# Patient Record
Sex: Female | Born: 1965
Health system: Southern US, Community
[De-identification: ages and names within clinical notes are randomized; demographics above are authoritative.]

## PROBLEM LIST (undated history)

## (undated) DIAGNOSIS — K219 Gastro-esophageal reflux disease without esophagitis: Secondary | ICD-10-CM

## (undated) DIAGNOSIS — R61 Generalized hyperhidrosis: Secondary | ICD-10-CM

## (undated) DIAGNOSIS — F329 Major depressive disorder, single episode, unspecified: Secondary | ICD-10-CM

## (undated) DIAGNOSIS — F419 Anxiety disorder, unspecified: Secondary | ICD-10-CM

## (undated) DIAGNOSIS — E8881 Metabolic syndrome: Secondary | ICD-10-CM

## (undated) DIAGNOSIS — F32A Depression, unspecified: Secondary | ICD-10-CM

## (undated) DIAGNOSIS — E785 Hyperlipidemia, unspecified: Secondary | ICD-10-CM

## (undated) DIAGNOSIS — R748 Abnormal levels of other serum enzymes: Secondary | ICD-10-CM

## (undated) DIAGNOSIS — R102 Pelvic and perineal pain: Secondary | ICD-10-CM

## (undated) DIAGNOSIS — G43909 Migraine, unspecified, not intractable, without status migrainosus: Secondary | ICD-10-CM

## (undated) DIAGNOSIS — G4700 Insomnia, unspecified: Secondary | ICD-10-CM

## (undated) HISTORY — DX: Depression, unspecified: F32.A

## (undated) HISTORY — DX: Hyperlipidemia, unspecified: E78.5

## (undated) HISTORY — DX: Metabolic syndrome: E88.81

## (undated) HISTORY — DX: Pelvic and perineal pain: R10.2

## (undated) HISTORY — DX: Insomnia, unspecified: G47.00

## (undated) HISTORY — DX: Metabolic syndrome: E88.810

## (undated) HISTORY — PX: EXCISION VAGINAL CYST: SHX5825

## (undated) HISTORY — DX: Gastro-esophageal reflux disease without esophagitis: K21.9

## (undated) HISTORY — DX: Generalized hyperhidrosis: R61

## (undated) HISTORY — DX: Major depressive disorder, single episode, unspecified: F32.9

## (undated) HISTORY — DX: Migraine, unspecified, not intractable, without status migrainosus: G43.909

## (undated) HISTORY — DX: Abnormal levels of other serum enzymes: R74.8

## (undated) HISTORY — DX: Anxiety disorder, unspecified: F41.9

---

## 1996-12-20 HISTORY — PX: TUBAL LIGATION: SHX77

## 1998-12-20 HISTORY — PX: SHOULDER ARTHROSCOPY: SHX128

## 2009-01-03 ENCOUNTER — Ambulatory Visit: Payer: Self-pay | Admitting: Internal Medicine

## 2009-06-02 ENCOUNTER — Ambulatory Visit: Payer: Self-pay | Admitting: Internal Medicine

## 2009-10-27 ENCOUNTER — Other Ambulatory Visit: Payer: Self-pay | Admitting: Internal Medicine

## 2009-10-30 ENCOUNTER — Ambulatory Visit: Payer: Self-pay | Admitting: Internal Medicine

## 2011-09-09 ENCOUNTER — Other Ambulatory Visit: Payer: Self-pay | Admitting: Internal Medicine

## 2013-07-30 ENCOUNTER — Ambulatory Visit: Payer: Self-pay | Admitting: Family

## 2013-12-20 HISTORY — PX: ABDOMINAL HYSTERECTOMY: SHX81

## 2014-03-06 ENCOUNTER — Encounter: Payer: Self-pay | Admitting: Obstetrics & Gynecology

## 2014-03-06 ENCOUNTER — Ambulatory Visit (INDEPENDENT_AMBULATORY_CARE_PROVIDER_SITE_OTHER): Payer: No Typology Code available for payment source | Admitting: Obstetrics & Gynecology

## 2014-03-06 VITALS — BP 113/69 | HR 90 | Ht <= 58 in | Wt 112.0 lb

## 2014-03-06 DIAGNOSIS — N938 Other specified abnormal uterine and vaginal bleeding: Secondary | ICD-10-CM

## 2014-03-06 DIAGNOSIS — N920 Excessive and frequent menstruation with regular cycle: Secondary | ICD-10-CM

## 2014-03-06 DIAGNOSIS — N949 Unspecified condition associated with female genital organs and menstrual cycle: Secondary | ICD-10-CM

## 2014-03-06 NOTE — Patient Instructions (Signed)
Fibroids Fibroids are lumps (tumors) that can occur any place in a woman's body. These lumps are not cancerous. Fibroids vary in size, weight, and where they grow. HOME CARE  Do not take aspirin.  Write down the number of pads or tampons you use during your period. Tell your doctor. This can help determine the best treatment for you. GET HELP RIGHT AWAY IF:  You have pain in your lower belly (abdomen) that is not helped with medicine.  You have cramps that are not helped with medicine.  You have more bleeding between or during your period.  You feel lightheaded or pass out (faint).  Your lower belly pain gets worse. MAKE SURE YOU:  Understand these instructions.  Will watch your condition.  Will get help right away if you are not doing well or get worse. Document Released: 01/08/2011 Document Revised: 02/28/2012 Document Reviewed: 01/08/2011 ExitCare Patient Information 2014 [employer].  

## 2014-03-06 NOTE — Progress Notes (Signed)
   Subjective:    Patient ID: Renee Park, female    DOB: [birth year], 48 y.o.   MRN: [id number]  HPI  48 yo MW Renee Park is here with long h/o menorrhagia and now DUB with worsening clots and pain. She had a EMBX this month that showed no cancer.  Review of Systems Pap smear and mammogram reportedly normal 8/14    Objective:   Physical Exam  Normal cervix, moderate blood in vault 10-12 week size mobile uterus, normal adnexal exam      Assessment & Plan:  Menorrhagia, DUB, fibroids on exam I will get an u/s, TSH, and CBC and proceed according to results She declines provera today

## 2014-03-06 NOTE — Progress Notes (Signed)
   Subjective:    Patient ID: Renee Park, female    DOB: January 12, 1966, 48 y.o.   MRN: [id number]  HPI  48 yo  MW [id number] (32, 30, and 25) here with the complaint of "messed up periods". Periods are usually very long, last weeks. She has also had worsening clots and some pain. She had a EMBX last week at [facility]. It was normal. She has not had an u/s or blood work.   Review of Systems Pap is UTD.    Objective:   Physical Exam        Assessment & Plan:  DUB/menorrhagia- check TSH, CBC, and gyn u/s.

## 2014-03-07 LAB — TSH: TSH: 1.531 u[IU]/mL (ref 0.350–4.500)

## 2014-03-07 LAB — CBC
HCT: 36.4 % (ref 36.0–46.0)
Hemoglobin: 11.9 g/dL — ABNORMAL LOW (ref 12.0–15.0)
MCH: 28.1 pg (ref 26.0–34.0)
MCHC: 32.7 g/dL (ref 30.0–36.0)
MCV: 85.8 fL (ref 78.0–100.0)
PLATELETS: 348 10*3/uL (ref 150–400)
RBC: 4.24 MIL/uL (ref 3.87–5.11)
RDW: 15.3 % (ref 11.5–15.5)
WBC: 11.6 10*3/uL — ABNORMAL HIGH (ref 4.0–10.5)

## 2014-03-12 ENCOUNTER — Ambulatory Visit (HOSPITAL_COMMUNITY)
Admission: RE | Admit: 2014-03-12 | Discharge: 2014-03-12 | Disposition: A | Discharge status: Home / Self Care | Payer: No Typology Code available for payment source | Source: Ambulatory Visit | Attending: Obstetrics & Gynecology | Admitting: Obstetrics & Gynecology

## 2014-03-12 DIAGNOSIS — N920 Excessive and frequent menstruation with regular cycle: Secondary | ICD-10-CM | POA: Insufficient documentation

## 2014-03-12 DIAGNOSIS — N938 Other specified abnormal uterine and vaginal bleeding: Secondary | ICD-10-CM

## 2014-03-12 DIAGNOSIS — D251 Intramural leiomyoma of uterus: Secondary | ICD-10-CM | POA: Insufficient documentation

## 2014-03-12 DIAGNOSIS — N83209 Unspecified ovarian cyst, unspecified side: Secondary | ICD-10-CM | POA: Insufficient documentation

## 2014-03-12 DIAGNOSIS — D252 Subserosal leiomyoma of uterus: Secondary | ICD-10-CM | POA: Insufficient documentation

## 2014-03-21 ENCOUNTER — Ambulatory Visit (INDEPENDENT_AMBULATORY_CARE_PROVIDER_SITE_OTHER): Payer: No Typology Code available for payment source | Admitting: Obstetrics & Gynecology

## 2014-03-21 ENCOUNTER — Encounter: Payer: Self-pay | Admitting: Obstetrics & Gynecology

## 2014-03-21 VITALS — BP 126/81 | HR 93 | Ht <= 58 in | Wt 110.0 lb

## 2014-03-21 DIAGNOSIS — N949 Unspecified condition associated with female genital organs and menstrual cycle: Secondary | ICD-10-CM

## 2014-03-21 DIAGNOSIS — N938 Other specified abnormal uterine and vaginal bleeding: Secondary | ICD-10-CM

## 2014-03-21 NOTE — Patient Instructions (Signed)
Endometrial Ablation Endometrial ablation removes the lining of the uterus (endometrium). It is usually a same-day, outpatient treatment. Ablation helps avoid major surgery, such as surgery to remove the cervix and uterus (hysterectomy). After endometrial ablation, you will have little or no menstrual bleeding and may not be able to have children. However, if you are premenopausal, you will need to use a reliable method of birth control following the procedure because of the small chance that pregnancy can occur. There are different reasons to have this procedure, which include:  Heavy periods.  Bleeding that is causing anemia.  Irregular bleeding.  Bleeding fibroids on the lining inside the uterus if they are smaller than 3 centimeters. This procedure should not be done if:  You want children in the future.  You have severe cramps with your menstrual period.  You have precancerous or cancerous cells in your uterus.  You were recently pregnant.  You have gone through menopause.  You have had major surgery on the uterus, such as a cesarean delivery. LET YOUR HEALTH CARE PROVIDER KNOW ABOUT:  Any allergies you have.  All medicines you are taking, including vitamins, herbs, eye drops, creams, and over-the-counter medicines.  Previous problems you or members of your family have had with the use of anesthetics.  Any blood disorders you have.  Previous surgeries you have had.  Medical conditions you have. RISKS AND COMPLICATIONS  Generally, this is a safe procedure. However, as with any procedure, complications can occur. Possible complications include:  Perforation of the uterus.  Bleeding.  Infection of the uterus, bladder, or vagina.  Injury to surrounding organs.  An air bubble to the lung (air embolus).  Pregnancy following the procedure.  Failure of the procedure to help the problem, requiring hysterectomy.  Decreased ability to diagnose cancer in the lining of  the uterus. BEFORE THE PROCEDURE  The lining of the uterus must be tested to make sure there is no pre-cancerous or cancer cells present.  An ultrasound may be performed to look at the size of the uterus and to check for abnormalities.  Medicines may be given to thin the lining of the uterus. PROCEDURE  During the procedure, your health care provider will use a tool called a resectoscope to help see inside your uterus. There are different ways to remove the lining of your uterus.   Radiofrequency  This method uses a radiofrequency-alternating electric current to remove the lining of the uterus.  Cryotherapy This method uses extreme cold to freeze the lining of the uterus.  Heated-Free Liquid  This method uses heated salt (saline) solution to remove the lining of the uterus.  Microwave This method uses high-energy microwaves to heat up the lining of the uterus to remove it.  Thermal balloon  This method involves inserting a catheter with a balloon tip into the uterus. The balloon tip is filled with heated fluid to remove the lining of the uterus. AFTER THE PROCEDURE  After your procedure, do not have sexual intercourse or insert anything into your vagina until permitted by your health care provider. After the procedure, you may experience:  Cramps.  Vaginal discharge.  Frequent urination. Document Released: 10/15/2004 Document Revised: 08/08/2013 Document Reviewed: 05/09/2013 ExitCare Patient Information 2014 [employer]. Hysterectomy Information  A hysterectomy is a surgery in which your uterus is removed. This surgery may be done to treat various medical problems. After the surgery, you will no longer have menstrual periods. The surgery will also make you unable to become pregnant (  sterile). The fallopian tubes and ovaries can be removed (bilateral salpingo-oophorectomy) during this surgery as well.  REASONS FOR A HYSTERECTOMY  Persistent, abnormal bleeding.  Lasting  (chronic) pelvic pain or infection.  The lining of the uterus (endometrium) starts growing outside the uterus (endometriosis).  The endometrium starts growing in the muscle of the uterus (adenomyosis).  The uterus falls down into the vagina (pelvic organ prolapse).  Noncancerous growths in the uterus (uterine fibroids) that cause symptoms.  Precancerous cells.  Cervical cancer or uterine cancer. TYPES OF HYSTERECTOMIES  Supracervical hysterectomy In this type, the top part of the uterus is removed, but not the cervix.  Total hysterectomy The uterus and cervix are removed.  Radical hysterectomy The uterus, the cervix, and the fibrous tissue that holds the uterus in place in the pelvis (parametrium) are removed. WAYS A HYSTERECTOMY CAN BE PERFORMED  Abdominal hysterectomy A large surgical cut (incision) is made in the abdomen. The uterus is removed through this incision.  Vaginal hysterectomy An incision is made in the vagina. The uterus is removed through this incision. There are no abdominal incisions.  Conventional laparoscopic hysterectomy Three or four small incisions are made in the abdomen. A thin, lighted tube with a camera (laparoscope) is inserted into one of the incisions. Other tools are put through the other incisions. The uterus is cut into small pieces. The small pieces are removed through the incisions, or they are removed through the vagina.  Laparoscopically assisted vaginal hysterectomy (LAVH) Three or four small incisions are made in the abdomen. Part of the surgery is performed laparoscopically and part vaginally. The uterus is removed through the vagina.  Robot-assisted laparoscopic hysterectomy A laparoscope and other tools are inserted into 3 or 4 small incisions in the abdomen. A computer-controlled device is used to give the surgeon a 3D image and to help control the surgical instruments. This allows for more precise movements of surgical instruments. The uterus  is cut into small pieces and removed through the incisions or removed through the vagina. RISKS AND COMPLICATIONS  Possible complications associated with this procedure include:  Bleeding and risk of blood transfusion. Tell your health care provider if you do not want to receive any blood products.  Blood clots in the legs or lung.  Infection.  Injury to surrounding organs.  Problems or side effects related to anesthesia.  Conversion to an abdominal hysterectomy from one of the other techniques. WHAT TO EXPECT AFTER A HYSTERECTOMY  You will be given pain medicine.  You will need to have someone with you for the first 3 5 days after you go home.  You will need to follow up with your surgeon in 2 4 weeks after surgery to evaluate your progress.  You may have early menopause symptoms such as hot flashes, night sweats, and insomnia.  If you had a hysterectomy for a problem that was not cancer or not a condition that could lead to cancer, then you no longer need Pap tests. However, even if you no longer need a Pap test, a regular exam is a good idea to make sure no other problems are starting. Document Released: 06/01/2001 Document Revised: 09/26/2013 Document Reviewed: 08/13/2013 ExitCare Patient Information 2014 [employer].  

## 2014-03-21 NOTE — Progress Notes (Signed)
   Subjective:    Patient ID: Renee Park, female    DOB: 30-Jul-1966, 48 y.o.   MRN: [id number]  HPI  Renee Park is a 48 yo MW lady here today to discuss her labs/u/s as work up for DUB. Her hbg is 11.9, TSH normal, u/s normal with 2 small fibroids (not submucosal).   Review of Systems In the past few months she has urinary pressure but no UTI per her. She also reports seeing blood with her urine.    Objective:   Physical Exam        Assessment & Plan:  DUB- probably perimenopausal DUB I have offered her OCPs, Novasure ablation, versus a hysterectomy. She and her husband will discuss it and call when they have a decision. I also offered for her to get a second opinion With regard to her urinary symptoms, I have suggested that she see a urologist.

## 2014-04-30 ENCOUNTER — Ambulatory Visit: Payer: Self-pay | Admitting: Obstetrics and Gynecology

## 2014-04-30 LAB — BASIC METABOLIC PANEL
Anion Gap: 7 (ref 7–16)
BUN: 12 mg/dL (ref 7–18)
CHLORIDE: 113 mmol/L — AB (ref 98–107)
CREATININE: 0.73 mg/dL (ref 0.60–1.30)
Calcium, Total: 9 mg/dL (ref 8.5–10.1)
Co2: 22 mmol/L (ref 21–32)
EGFR (African American): >60
EGFR (Non-African Amer.): >60
Glucose: 88 mg/dL (ref 65–99)
Osmolality: 282 (ref 275–301)
POTASSIUM: 3.6 mmol/L (ref 3.5–5.1)
Sodium: 142 mmol/L (ref 136–145)

## 2014-04-30 LAB — CBC
HCT: 36.5 % (ref 35.0–47.0)
HGB: 12.3 g/dL (ref 12.0–16.0)
MCH: 30 pg (ref 26.0–34.0)
MCHC: 33.7 g/dL (ref 32.0–36.0)
MCV: 89 fL (ref 80–100)
PLATELETS: 337 10*3/uL (ref 150–440)
RBC: 4.1 10*6/uL (ref 3.80–5.20)
RDW: 15.8 % — ABNORMAL HIGH (ref 11.5–14.5)
WBC: 6.5 10*3/uL (ref 3.6–11.0)

## 2014-05-06 ENCOUNTER — Inpatient Hospital Stay: Payer: Self-pay | Admitting: Obstetrics and Gynecology

## 2014-05-07 LAB — PATHOLOGY REPORT

## 2014-05-07 LAB — HEMOGLOBIN: HGB: 9.8 g/dL — ABNORMAL LOW (ref 12.0–16.0)

## 2014-10-04 ENCOUNTER — Ambulatory Visit: Payer: Self-pay | Admitting: Obstetrics and Gynecology

## 2014-10-21 ENCOUNTER — Encounter: Payer: Self-pay | Admitting: Obstetrics & Gynecology

## 2014-11-08 ENCOUNTER — Ambulatory Visit: Payer: Self-pay | Admitting: Internal Medicine

## 2015-01-06 IMAGING — US US PELVIS COMPLETE
1 series · 13 of 25 positions shown · non-contrast
Comparison: None

CLINICAL DATA: Menorrhagia



[Series 1: us pelvis complete · 13 of 87 slices shown]
[im 1/87]
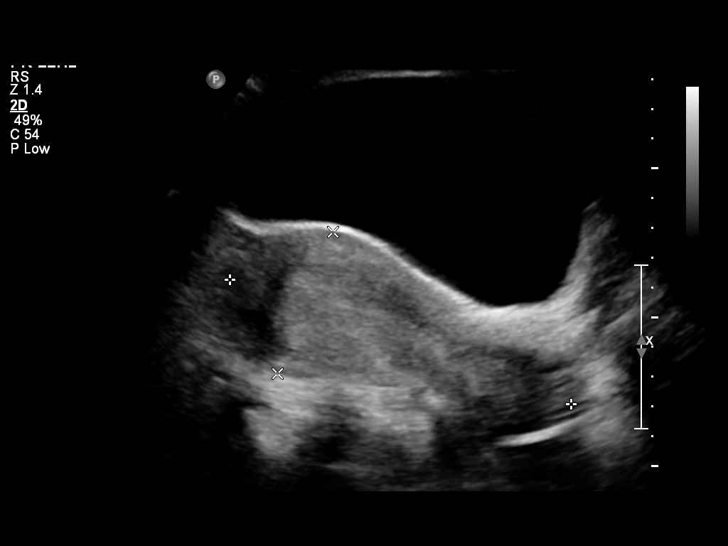
[im 8/87]
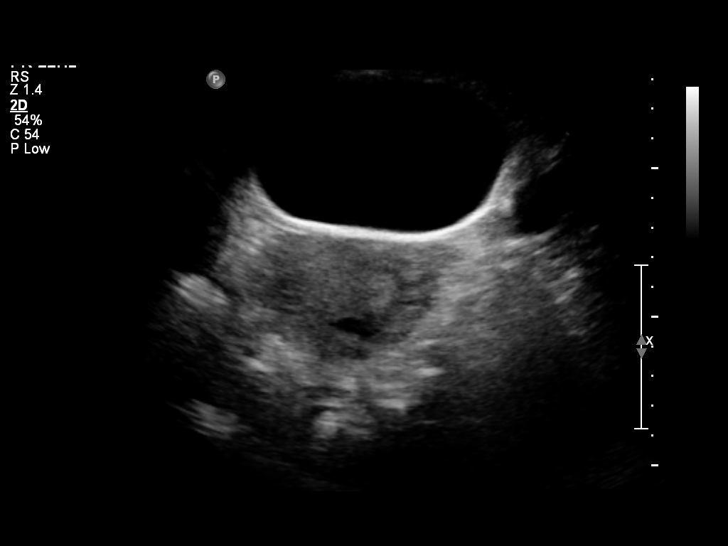
[im 15/87]
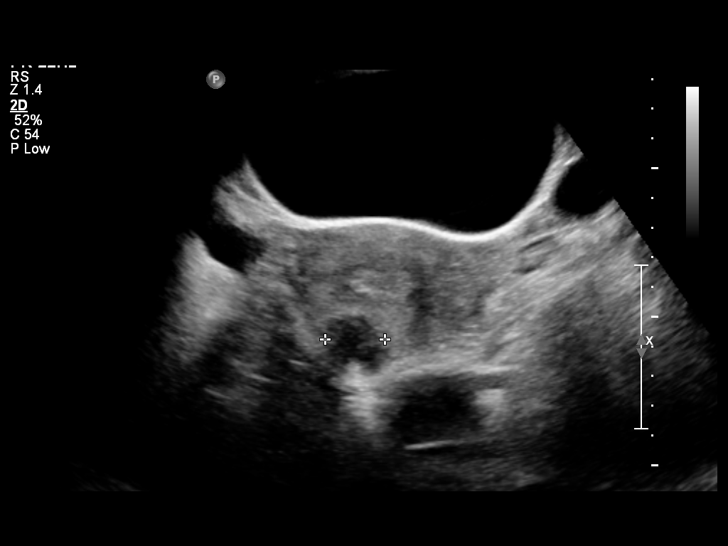
[im 22/87]
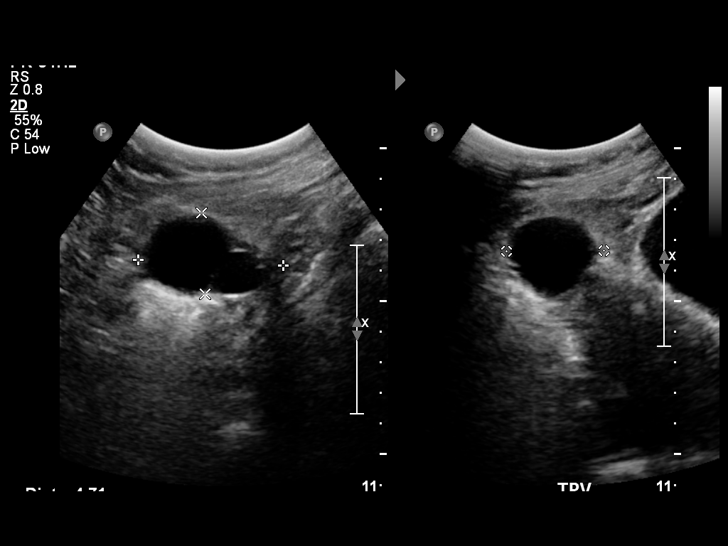
[im 29/87]
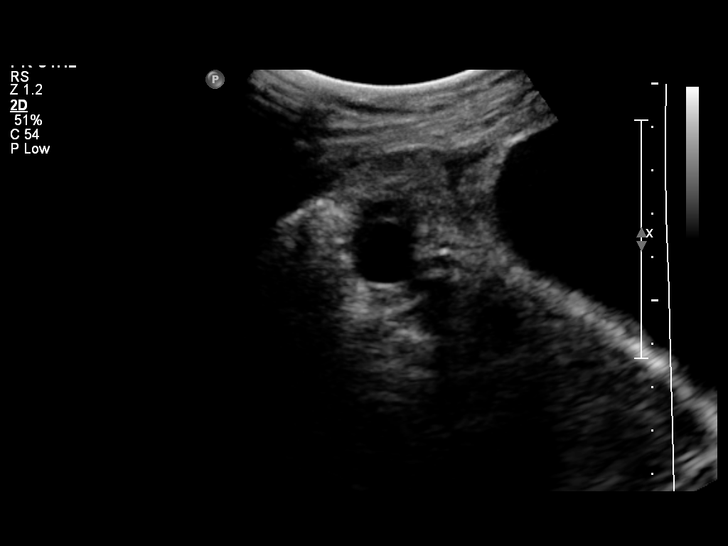
[im 36/87]
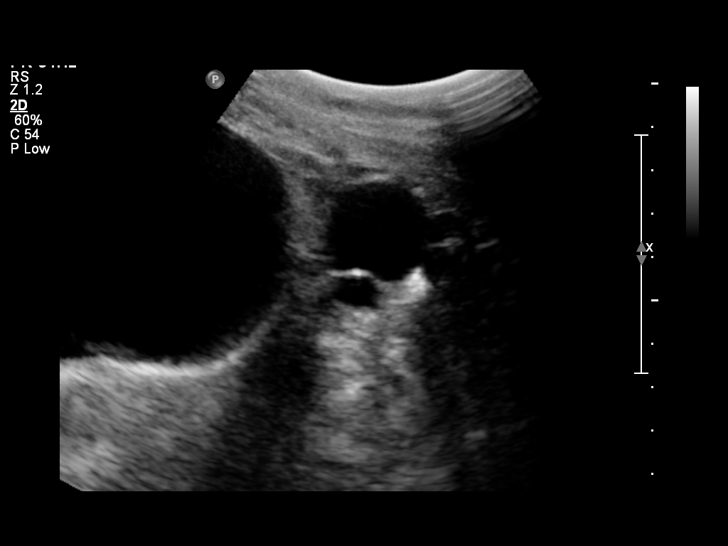
[im 44/87]
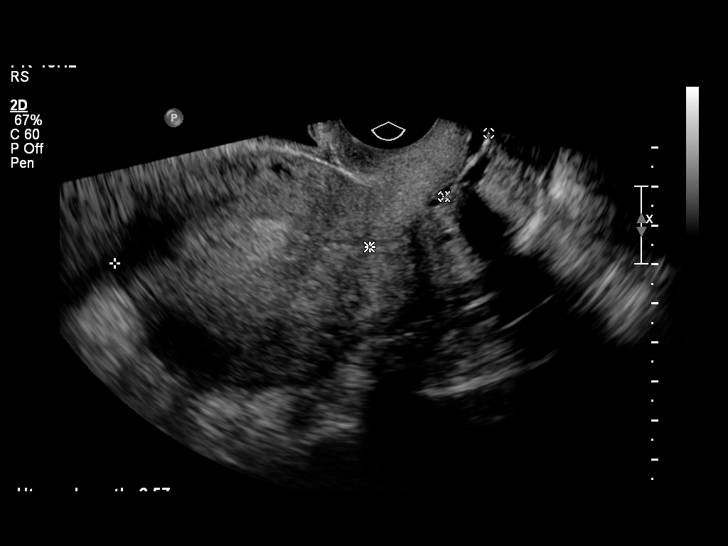
[im 51/87]
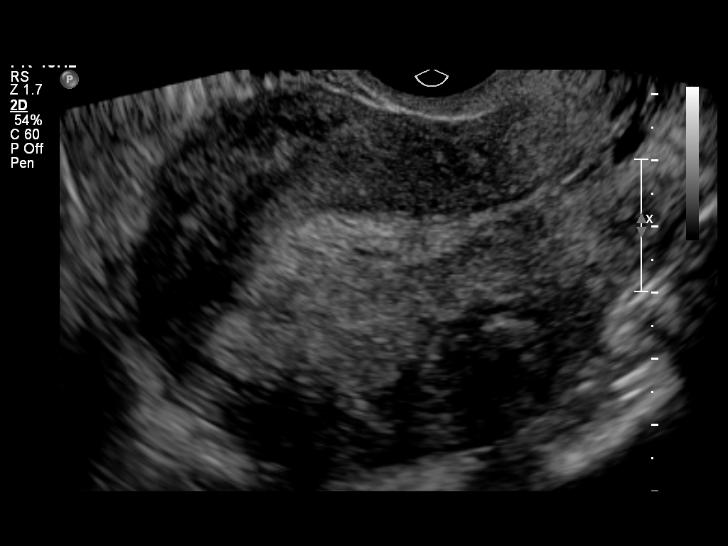
[im 58/87]
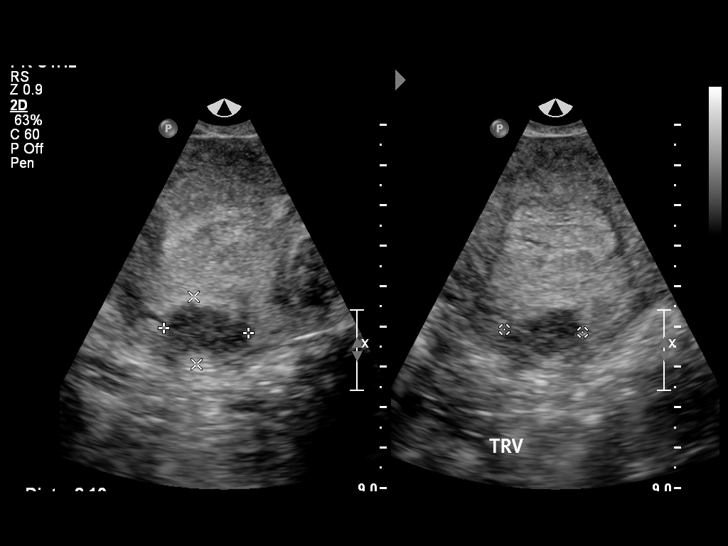
[im 65/87]
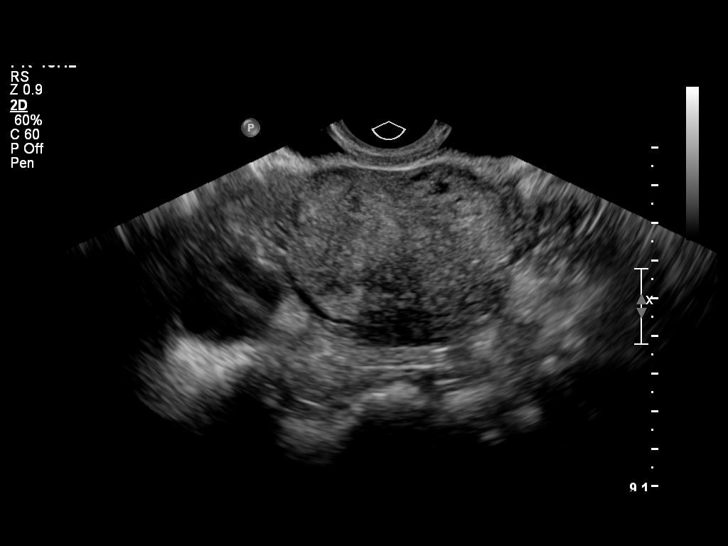
[im 72/87]
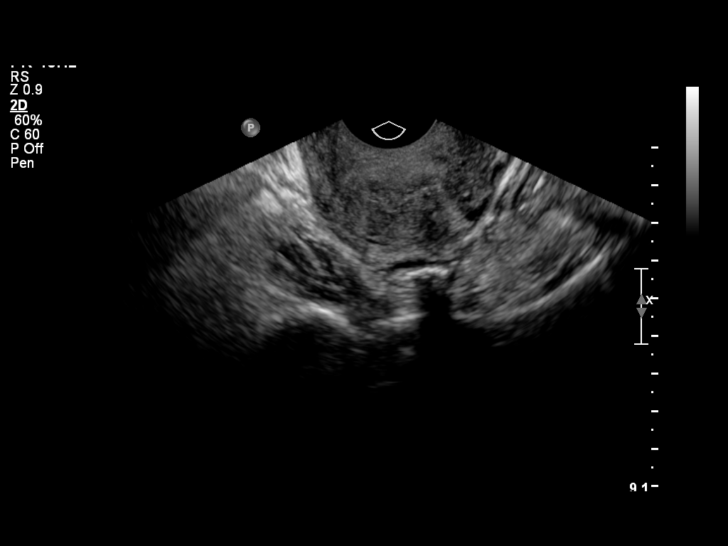
[im 79/87]
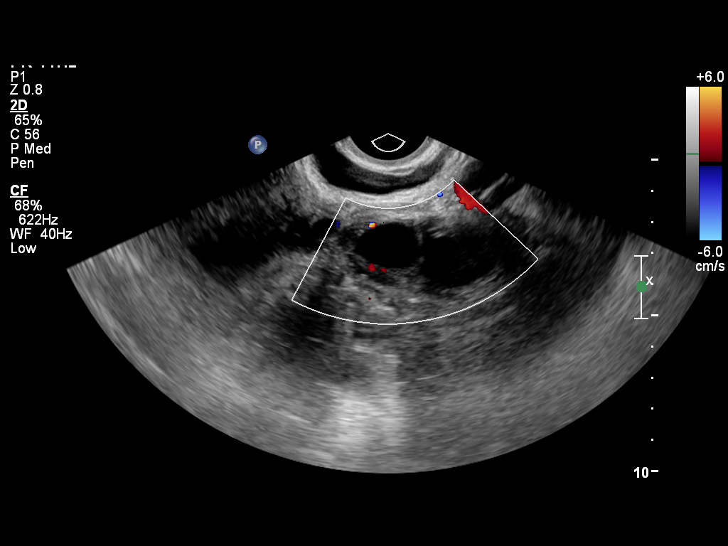
[im 87/87]
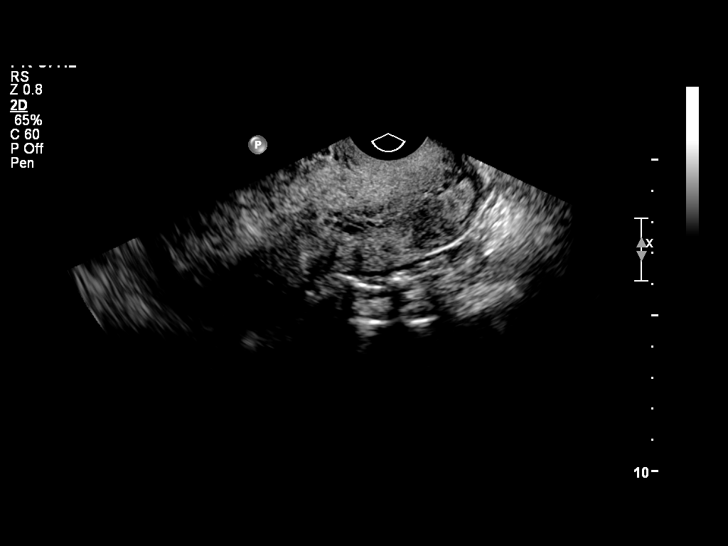

[13 of 25 positions shown; findings below may reference images not displayed]

FINDINGS: Uterus

Measurements: 10.9 x 5.9 x 6.2 cm. Subserosal midline fundal fibroid
measures 2.1 x 1.9 x 1.7 cm. Posterior intramural fibroid measures
2.2 x 2.1 x 1.7 cm.

Endometrium

Thickness: 10.5 mm. Mildly heterogeneous. Few scattered tiny cystic
areas within the endometrium.

Right ovary

Measurements: 4.7 x 2.6 x 3.2 cm. Small cysts or follicles 2.5 cm or
less in size. No adnexal masses. No suspicious abnormality.

Left ovary

Measurements: 5.2 x 2.7 x 5.3 cm.. Small cysts or follicles 2.6 cm
or less in size. No adnexal masses. No suspicious abnormality.

Other findings

No free fluid.
IMPRESSION: At least 2 small fibroids as described above.

Few scattered tiny cystic areas within the endometrium. Consider
followup pelvic ultrasound in the 3-6 months to assess stability.

## 2015-04-12 NOTE — Op Note (Signed)
PATIENT NAME:  Renee Park, Renee Park MR#:  [id number] DATE OF BIRTH:  13-Jun-1966  DATE OF PROCEDURE:  05/06/2014  PREOPERATIVE DIAGNOSES:  1.  Menorrhagia.  2.  Leiomyoma uteri.  3.  Vaginal cysts x 2.  POSTOPERATIVE DIAGNOSES: 1.  Menorrhagia.  2.  Leiomyoma uteri.  3.  Vaginal cysts x 2.  OPERATIVE PROCEDURE:  1.  Excisional biopsy of vaginal cysts x 2.  2.  Total abdominal hysterectomy with bilateral salpingo-oophorectomy.   SURGEON: Brayton Mars, M.D.   FIRST ASSISTANT: Dr. Marcelline Mates.  ANESTHESIA: General endotracheal.   INDICATIONS: The patient is a 49 year old white female with history of menorrhagia to anemia secondary to uterine fibroids, presents for definitive surgery. The patient also has 2 cystic lesions within the vagina that are each approximately 3 cm in diameter. These are to be drained and biopsied.   FINDINGS AT SURGERY: Multi-fibroid uterus approximately 12 weeks' size. There were minimal adhesions between the omentum and right adnexal region. The tubes and ovaries were grossly normal. The vagina was notable for two 3 cm cysts located in the distal vagina, the first was noted on the left side at the introitus while the second was noted just inside the introitus on the right side. Both were mucin filled.   DESCRIPTION OF PROCEDURE: The patient was brought to the operating room where she was placed in the supine position. General endotracheal anesthesia was induced without difficulty. She was placed in the low lithotomy position using the bumblebee stirrups. A ChloraPrep and Betadine abdominal, perineal, intravaginal prep and drape was performed in standard fashion. Foley catheter was placed and was draining clear yellow urine. The vaginal cyst biopsies were then performed. The vaginal cyst was grasped with an Allis clamp and excisional biopsy was made using an elliptical incision. This was done on both sides of the vagina. The cyst wall and vagina were sent for  pathology. Mucin secretions were released. The biopsy sites were closed with a simple running suture of 2-0 chromic. Good hemostasis was noted. Upon completion of the vaginal cyst excision, hysterectomy was then performed in routine fashion.   The patient was placed in the lithotomy position. Operator's gloves were changed. The abdomen was opened with a Pfannenstiel incision. Fascia was incised transversely and extended bilaterally with Mayo scissors. The rectus muscle was dissected off the fascia through sharp and blunt dissection. The midline raphe was identified, separated and the peritoneum was entered. The uterus was mobile and was able to be elevated to the abdominal wall. The adhesions between the omentum and the right adnexa were taken down with Bovie cautery. Using a malleable retractor as well as a Metallurgist, exposure was obtained and procedure was performed in routine fashion. The round ligaments were doubly clamped, cut, and stick tied using 0 Vicryl suture. These were tagged. The posterior and anterior leafs of the broad ligament were opened. The infundibulopelvic ligament was isolated on each side and a window was created. Ligaments were then doubly clamped and cut using 0 Vicryl suture. The first tie was a free tie with a second suture being placed, which was a stick tie. The ligation of these vessels was noted to be away from the course of the ureters.   Next, bladder flap was created through sharp and blunt dissection. The cardinal and broad ligament complexes were skeletonized to expose the uterine vessels. Uterine vessels were doubly clamped, cut, and stick tied using 0 Vicryl suture. The remainder of the cardinal and broad ligament complexes were then  taken down using the clamping, cutting, and stick tying technique. This was carried out to the level of the cervicovaginal junction, which was then crossclamped with curved Heaney clamps. The specimen was then excised from the  operative field. The angles of the vagina were closed using a Richardson stitch of 0 Vicryl. The intervening vaginal cuff was reapproximated using 2-0 Vicryl sutures in a simple interrupted manner using figure-of-eight stitches. Following closure of the cuff, the pelvis was irrigated and hemostasis was noted. All pedicles were noted to be dry. The appendix was noted to be very small, atrophic, and normal in appearance. Upper abdomen revealed no adhesions or masses. The abdomen was then closed in layers with 0 Maxon being used on the fascia in a simple running manner. The skin was closed with a subcuticular stitch of 4-0 Vicryl. Dermabond was placed over the incision. A Lidoderm patch was placed on both superior and inferior aspects of the incision. Telfa and 4 x 4 pads were used to cover the wound. The patient was then awakened, extubated and taken to the recovery room in satisfactory condition.   ESTIMATED BLOOD LOSS: 100 mL.   IV FLUIDS: 1500 mL.   URINE OUTPUT: 75 mL.  ANTIBIOTICS: The patient did receive 1 gram Ancef antibiotic prophylaxis.  ____________________________ Alanda Slim Nhat Hearne, MD mad:aw D: 05/06/2014 09:57:22 ET T: 05/06/2014 10:17:34 ET JOB#: [id number]  cc: Hassell Done A. Sibbie Flammia, MD, <Dictator> [facility] MD ELECTRONICALLY SIGNED 05/06/2014 13:44

## 2015-05-07 ENCOUNTER — Ambulatory Visit (INDEPENDENT_AMBULATORY_CARE_PROVIDER_SITE_OTHER): Payer: 59 | Admitting: General Surgery

## 2015-05-07 ENCOUNTER — Encounter: Payer: Self-pay | Admitting: General Surgery

## 2015-05-07 VITALS — BP 122/78 | HR 78 | Resp 13 | Ht <= 58 in | Wt 106.8 lb

## 2015-05-07 DIAGNOSIS — L02416 Cutaneous abscess of left lower limb: Secondary | ICD-10-CM | POA: Diagnosis not present

## 2015-05-07 MED ORDER — DOXYCYCLINE HYCLATE 50 MG PO CAPS: 50.0000 mg | ORAL_CAPSULE | Freq: Two times a day (BID) | ORAL | Status: DC

## 2015-05-07 NOTE — Patient Instructions (Signed)
The patient is aware to call back for any questions or concerns.  

## 2015-05-07 NOTE — Progress Notes (Signed)
Patient ID: Renee Park, female   DOB: 1966-12-12, 49 y.o.   MRN: [id number]  Chief Complaint  Patient presents with  . Other    boil    HPI Renee Park is a 49 y.o. female here for evaluation of a boil on her buttocks. She states that it has been there since 04/28/15. It started out as a small sore bump and gradually got worse. She was seen by Dr Rebecka Apley on 05/02/15 and placed on Keflex. She used hot compresses on it and it has improved. Yesterday it started draining. It still feels like a hard lump is there.  HPI  Past Medical History  Diagnosis Date  . Migraine   . Anxiety   . GERD (gastroesophageal reflux disease)   . Hyperlipidemia     Past Surgical History  Procedure Laterality Date  . Shoulder arthroscopy Left 2000  . Abdominal hysterectomy  2015  . Tubal ligation  1998    Family History  Problem Relation Age of Onset  . Cancer Mother     Leukemia  . Diabetes Father   . Diabetes Paternal Uncle   . Diabetes Paternal Grandfather     Social History History  Substance Use Topics  . Smoking status: Former Smoker    Quit date: 12/21/2003  . Smokeless tobacco: Never Used  . Alcohol Use: 0.0 oz/week    0 Standard drinks or equivalent per week     Comment: occas.    No Known Allergies  Current Outpatient Prescriptions  Medication Sig Dispense Refill  . ALPRAZolam (XANAX) 0.25 MG tablet     . butalbital-acetaminophen-caffeine (FIORICET, ESGIC) 50-325-40 MG per tablet Take 2 tablets by mouth every 6 (six) hours as needed for headache.    . cephALEXin (KEFLEX) 500 MG capsule Take 500 mg by mouth 4 (four) times daily.  0  . estradiol (ESTRACE) 1 MG tablet   6  . loratadine (CLARITIN) 10 MG tablet Take 10 mg by mouth daily as needed.   0  . mometasone (NASONEX) 50 MCG/ACT nasal spray Place 2 sprays into the nose daily.    Marland Kitchen omeprazole (PRILOSEC) 40 MG capsule     . rosuvastatin (CRESTOR) 5 MG tablet Take 5 mg by mouth daily.    Marland Kitchen topiramate (TOPAMAX) 50 MG  tablet     . doxycycline (VIBRAMYCIN) 50 MG capsule Take 1 capsule (50 mg total) by mouth 2 (two) times daily. 14 capsule 0   No current facility-administered medications for this visit.    Review of Systems Review of Systems  Constitutional: Negative.   Respiratory: Negative.   Cardiovascular: Negative.     Blood pressure 122/78, pulse 78, resp. rate 13, height 4\' 8"  (1.422 m), weight 106 lb 12.8 oz (48.444 kg), last menstrual period 02/01/2014.  Physical Exam Physical Exam  Constitutional: She is oriented to person, place, and time. She appears well-developed and well-nourished.  Eyes: Conjunctivae are normal. No scleral icterus.  Neurological: She is alert and oriented to person, place, and time.  Skin: Skin is warm and dry.          Data Reviewed Office notes.  Assessment    Abscess posterior left thigh. Possibly resulting from insect bite.    Plan   I and D performed with consent. 20ml 1% xylocaine, betadine prep. Cruciate incision made and some thin purulent fluid drained. Simple 4/4 dressing. Doxycycline 100mg  BID #14   Follow up 3-4 weeks.       PCP: Dr. Nadine Counts Ref:  Dr Leroy Sea G 05/07/2015, 11:43 AM

## 2015-05-29 ENCOUNTER — Ambulatory Visit: Payer: 59 | Admitting: General Surgery

## 2015-07-10 ENCOUNTER — Other Ambulatory Visit: Payer: Self-pay | Admitting: Family Medicine

## 2015-07-10 DIAGNOSIS — F418 Other specified anxiety disorders: Secondary | ICD-10-CM

## 2015-07-16 ENCOUNTER — Other Ambulatory Visit: Payer: Self-pay | Admitting: Family Medicine

## 2015-07-16 DIAGNOSIS — G43001 Migraine without aura, not intractable, with status migrainosus: Secondary | ICD-10-CM

## 2015-07-16 DIAGNOSIS — E785 Hyperlipidemia, unspecified: Secondary | ICD-10-CM

## 2015-07-17 ENCOUNTER — Encounter: Payer: Self-pay | Admitting: *Deleted

## 2015-08-05 ENCOUNTER — Other Ambulatory Visit: Payer: Self-pay

## 2015-08-05 DIAGNOSIS — F418 Other specified anxiety disorders: Secondary | ICD-10-CM

## 2015-08-05 NOTE — Telephone Encounter (Signed)
ALPRAZolam 0.25MG , 1 (one) Tablet two times daily as needed, #60, 30 days starting 01/03/2015, Ref. x2. Active. Associated Diagnosis:Anxiety (300.00  F41.9)  Recorded 01/03/2015 10:05 AM by [name], MD, Office Visit.  Refill request was sent to Dr. [name] for approval and submission.Renee Park

## 2016-01-19 ENCOUNTER — Ambulatory Visit (INDEPENDENT_AMBULATORY_CARE_PROVIDER_SITE_OTHER): Payer: 59 | Admitting: General Surgery

## 2016-01-19 ENCOUNTER — Encounter: Payer: Self-pay | Admitting: General Surgery

## 2016-01-19 ENCOUNTER — Telehealth: Payer: Self-pay | Admitting: *Deleted

## 2016-01-19 VITALS — BP 120/80 | HR 80 | Resp 12 | Ht <= 58 in | Wt 107.0 lb

## 2016-01-19 DIAGNOSIS — L03119 Cellulitis of unspecified part of limb: Secondary | ICD-10-CM

## 2016-01-19 DIAGNOSIS — L02419 Cutaneous abscess of limb, unspecified: Secondary | ICD-10-CM

## 2016-01-19 MED ORDER — DOXYCYCLINE HYCLATE 100 MG PO CAPS: 100.0000 mg | ORAL_CAPSULE | Freq: Two times a day (BID) | ORAL | Status: DC

## 2016-01-19 NOTE — Progress Notes (Signed)
Patient ID: Renee Park, female   DOB: [birth year], 50 y.o.   MRN: [id number]  Chief Complaint  Patient presents with  . Other    left thigh abscess    HPI Renee Park is a 50 y.o. female here today for an evaluation of a left thigh abscess. Patient states she first noticed this area last Wednesday, and it has since started draining. She states the area started draining on  Friday. Last since in May 2016, for similar thing close to this area. Denies trauma or known bites to the area. She has not taken antibiotics. I have reviewed the history of present illness with the patient.  HPI  Past Medical History  Diagnosis Date  . Migraine   . Anxiety   . GERD (gastroesophageal reflux disease)   . Hyperlipidemia     Past Surgical History  Procedure Laterality Date  . Shoulder arthroscopy Left 2000  . Abdominal hysterectomy  2015  . Tubal ligation  1998    Family History  Problem Relation Age of Onset  . Cancer Mother     Leukemia  . Diabetes Father   . Diabetes Paternal Uncle   . Diabetes Paternal Grandfather     Social History Social History  Substance Use Topics  . Smoking status: Former Smoker    Quit date: 12/21/2003  . Smokeless tobacco: Never Used  . Alcohol Use: 0.0 oz/week    0 Standard drinks or equivalent per week     Comment: occas.    No Known Allergies  Current Outpatient Prescriptions  Medication Sig Dispense Refill  . ALPRAZolam (XANAX) 0.25 MG tablet TAKE 1 TABLET BY MOUTH TWICE DAILY AS NEEDED. 40 tablet 2  . butalbital-acetaminophen-caffeine (FIORICET, ESGIC) 50-325-40 MG per tablet Take 2 tablets by mouth every 6 (six) hours as needed for headache.    . estradiol (ESTRACE) 1 MG tablet   6  . loratadine (CLARITIN) 10 MG tablet Take 10 mg by mouth daily as needed.   0  . mometasone (NASONEX) 50 MCG/ACT nasal spray Place 2 sprays into the nose daily.    Marland Kitchen omeprazole (PRILOSEC) 40 MG capsule     . topiramate (TOPAMAX) 50 MG tablet TAKE 1  TABLET BY MOUTH TWICE DAILY 60 tablet 5  . doxycycline (VIBRAMYCIN) 100 MG capsule Take 1 capsule (100 mg total) by mouth 2 (two) times daily. 20 capsule 0   No current facility-administered medications for this visit.    Review of Systems Review of Systems  Constitutional: Negative.   Respiratory: Negative.   Cardiovascular: Negative.     Blood pressure 120/80, pulse 80, resp. rate 12, height 4\' 8"  (1.422 m), weight 107 lb (48.535 kg), last menstrual period 02/01/2014.  Physical Exam Physical Exam  Constitutional: She appears well-developed and well-nourished.  Lymphadenopathy:       Right: No inguinal adenopathy present.       Left: No inguinal adenopathy present.  Skin: Skin is warm and dry.  4x 3cm abscess left posterior upper thigh close to the gluteal fold, mostly induration with a central 38mm circular opening. No pus seen, still suggestive of an insect bite.    Data Reviewed Notes reviewed  Assessment    Left posterior upper thigh abscess/ insect bite     Plan    Rx for Doxycycline sent to pharmacy. Patient to follow up in 2 days. May need excision of the central ulcer.     PCP:  Nadine Counts This information has been scribed by Janett Billow  Qualls CMA.    Jalaila Caradonna G 01/20/2016, 11:33 AM

## 2016-01-19 NOTE — Telephone Encounter (Signed)
Patient was seen in 04/2015 for abscess on left thigh, it is now back with a small hole with yellow/green discharge. Patient wants to know if she can been seen today?

## 2016-01-19 NOTE — Telephone Encounter (Signed)
Patient now has appointment today at 3:15pm

## 2016-01-19 NOTE — Patient Instructions (Addendum)
Take antibiotics as prescribed and follow-up in 2 days.

## 2016-01-20 ENCOUNTER — Encounter: Payer: Self-pay | Admitting: General Surgery

## 2016-01-21 ENCOUNTER — Ambulatory Visit (INDEPENDENT_AMBULATORY_CARE_PROVIDER_SITE_OTHER): Payer: Managed Care, Other (non HMO) | Admitting: General Surgery

## 2016-01-21 ENCOUNTER — Ambulatory Visit: Payer: 59 | Admitting: General Surgery

## 2016-01-21 VITALS — BP 118/80 | HR 74 | Resp 12 | Ht <= 58 in | Wt 107.0 lb

## 2016-01-21 DIAGNOSIS — L02419 Cutaneous abscess of limb, unspecified: Secondary | ICD-10-CM

## 2016-01-21 DIAGNOSIS — L03119 Cellulitis of unspecified part of limb: Secondary | ICD-10-CM

## 2016-01-21 NOTE — Patient Instructions (Signed)
Finish antibiotics and followup in 2-3 weeks.

## 2016-01-21 NOTE — Progress Notes (Signed)
This is a 50 year old female here today for her two day follow up left thigh abscess. Says the area feels much better I have reviewed the history of present illness with the patient.  Exam- Markedly less redness and induration. Also less tender  Patient to finish antibiotic and follow up in 2-3 weeks.   PCP:  Nadine Counts This information has been scribed by Gaspar Cola CMA.

## 2016-01-22 ENCOUNTER — Encounter: Payer: Self-pay | Admitting: General Surgery

## 2016-02-02 ENCOUNTER — Other Ambulatory Visit: Payer: Self-pay | Admitting: Family Medicine

## 2016-02-10 ENCOUNTER — Ambulatory Visit (INDEPENDENT_AMBULATORY_CARE_PROVIDER_SITE_OTHER): Payer: Managed Care, Other (non HMO) | Admitting: General Surgery

## 2016-02-10 ENCOUNTER — Encounter: Payer: Self-pay | Admitting: General Surgery

## 2016-02-10 VITALS — BP 116/68 | HR 70 | Resp 12 | Ht <= 58 in | Wt 109.0 lb

## 2016-02-10 DIAGNOSIS — L02416 Cutaneous abscess of left lower limb: Secondary | ICD-10-CM | POA: Diagnosis not present

## 2016-02-10 NOTE — Progress Notes (Signed)
This is a 50 year old female here today for her follow up left thigh abscess. She states the area feels much better. I have reviewed the history of present illness with the patient. The area is fully healed. No open area or drainage. Follow up as needed. The patient is aware to call back for any questions or concerns.   PCP: [name] This information has been scribed by Karie Fetch RNBC.

## 2016-02-10 NOTE — Patient Instructions (Signed)
The patient is aware to call back for any questions or concerns.  

## 2016-02-12 ENCOUNTER — Encounter: Payer: Self-pay | Admitting: General Surgery

## 2016-04-28 ENCOUNTER — Other Ambulatory Visit: Payer: Self-pay | Admitting: Obstetrics and Gynecology

## 2016-04-28 ENCOUNTER — Other Ambulatory Visit: Payer: Self-pay | Admitting: Internal Medicine

## 2016-04-28 DIAGNOSIS — Z1231 Encounter for screening mammogram for malignant neoplasm of breast: Secondary | ICD-10-CM

## 2016-05-04 ENCOUNTER — Ambulatory Visit
Admission: RE | Admit: 2016-05-04 | Discharge: 2016-05-04 | Disposition: A | Discharge status: Home / Self Care | Payer: Managed Care, Other (non HMO) | Source: Ambulatory Visit | Attending: Internal Medicine | Admitting: Internal Medicine

## 2016-05-04 DIAGNOSIS — Z1231 Encounter for screening mammogram for malignant neoplasm of breast: Secondary | ICD-10-CM | POA: Diagnosis not present

## 2016-05-20 ENCOUNTER — Ambulatory Visit: Payer: Self-pay | Admitting: Obstetrics and Gynecology

## 2016-05-26 ENCOUNTER — Ambulatory Visit (INDEPENDENT_AMBULATORY_CARE_PROVIDER_SITE_OTHER): Payer: Managed Care, Other (non HMO) | Admitting: Obstetrics and Gynecology

## 2016-05-26 ENCOUNTER — Encounter: Payer: Self-pay | Admitting: Obstetrics and Gynecology

## 2016-05-26 VITALS — BP 108/73 | HR 80 | Ht <= 58 in | Wt 109.8 lb

## 2016-05-26 DIAGNOSIS — Z90710 Acquired absence of both cervix and uterus: Secondary | ICD-10-CM | POA: Diagnosis not present

## 2016-05-26 DIAGNOSIS — Z0000 Encounter for general adult medical examination without abnormal findings: Secondary | ICD-10-CM | POA: Diagnosis not present

## 2016-05-26 DIAGNOSIS — E8940 Asymptomatic postprocedural ovarian failure: Secondary | ICD-10-CM

## 2016-05-26 DIAGNOSIS — Z9079 Acquired absence of other genital organ(s): Secondary | ICD-10-CM

## 2016-05-26 DIAGNOSIS — Z01419 Encounter for gynecological examination (general) (routine) without abnormal findings: Secondary | ICD-10-CM

## 2016-05-26 DIAGNOSIS — Z1211 Encounter for screening for malignant neoplasm of colon: Secondary | ICD-10-CM

## 2016-05-26 DIAGNOSIS — Z79890 Hormone replacement therapy (postmenopausal): Secondary | ICD-10-CM

## 2016-05-26 DIAGNOSIS — Z90722 Acquired absence of ovaries, bilateral: Secondary | ICD-10-CM | POA: Diagnosis not present

## 2016-05-26 NOTE — Patient Instructions (Signed)
1. No Pap smear is needed. 2. Mammogram has already been obtained this year-BI-RADS 1 3. Stool guaiac cards are given. 4. Continue with healthy eating and exercise with maintaining weight 5. Estradiol 1 mg daily is refilled 6. Return in 1 year

## 2016-05-26 NOTE — Progress Notes (Signed)
ANNUAL PREVENTATIVE CARE GYN  ENCOUNTER NOTE  Subjective:       Renee Park is a 50 y.o. [phone] female here for a routine annual gynecologic exam.  Current complaints: 1. None  Patient is status post TAH/BSO in 2015. Bowel and bladder function are normal. Currently she is on estradiol 1 mg daily with excellent control vasomotor symptoms. Patient is actively exercising and is maintaining her weight. Recent laboratory survey including cholesterol, Resident metabolic panel, TSH, and CBC are normal-excellent    Gynecologic History Patient's last menstrual period was 02/01/2014. Contraception: status post hysterectomy Last Pap: No abnormals; no need for further Pap smears unless abnormalities identified on exam Last mammogram: 04/2016 BI-RADS 1  Obstetric History OB History  Gravida Para Term Preterm AB SAB TAB Ectopic Multiple Living  4 3 3  1 1    3     # Outcome Date GA Lbr Len/2nd Weight Sex Delivery Anes PTL Lv  4 SAB           3 Term           2 Term           1 Term             Obstetric Comments  Menstrual age:     Age 1st Pregnancy: 13    Past Medical History  Diagnosis Date  . Migraine   . Anxiety   . GERD (gastroesophageal reflux disease)   . Hyperlipidemia   . Insomnia   . Excessive sweating   . Dysmetabolic syndrome X   . Pelvic pain in female   . Abnormal liver enzymes   . Depression     Past Surgical History  Procedure Laterality Date  . Shoulder arthroscopy Left 2000  . Tubal ligation  1998  . Abdominal hysterectomy  2015    tah/bso  . Excision vaginal cyst      Current Outpatient Prescriptions on File Prior to Visit  Medication Sig Dispense Refill  . ALPRAZolam (XANAX) 0.25 MG tablet TAKE 1 TABLET BY MOUTH TWICE DAILY AS NEEDED. 40 tablet 2  . butalbital-acetaminophen-caffeine (FIORICET, ESGIC) 50-325-40 MG per tablet Take 2 tablets by mouth every 6 (six) hours as needed for headache.    . estradiol (ESTRACE) 1 MG tablet   6  . loratadine  (CLARITIN) 10 MG tablet Take 10 mg by mouth daily as needed.   0  . mometasone (NASONEX) 50 MCG/ACT nasal spray Place 2 sprays into the nose daily.    Marland Kitchen omeprazole (PRILOSEC) 40 MG capsule     . rosuvastatin (CRESTOR) 10 MG tablet TK 1 T PO QD  4  . topiramate (TOPAMAX) 50 MG tablet TAKE 1 TABLET BY MOUTH TWICE DAILY 180 tablet 1   No current facility-administered medications on file prior to visit.    Allergies  Allergen Reactions  . Lipitor [Atorvastatin]   . Vytorin [Ezetimibe-Simvastatin]     Social History   Social History  . Marital Status: Married    Spouse Name: N/A  . Number of Children: N/A  . Years of Education: N/A   Occupational History  . Not on file.   Social History Main Topics  . Smoking status: Former Smoker    Quit date: 12/21/2003  . Smokeless tobacco: Never Used  . Alcohol Use: 0.0 oz/week    0 Standard drinks or equivalent per week     Comment: occas.  . Drug Use: No  . Sexual Activity:    Partners: Male  Birth Control/ Protection: Surgical   Other Topics Concern  . Not on file   Social History Narrative    Family History  Problem Relation Age of Onset  . Cancer Mother     Leukemia  . Hypertension Mother   . Diabetes Father   . Diabetes Paternal Uncle   . Diabetes Paternal Grandfather     The following portions of the patient's history were reviewed and updated as appropriate: allergies, current medications, past family history, past medical history, past social history, past surgical history and problem list.  Review of Systems ROS Review of Systems - General ROS: negative for - chills, fatigue, fever, hot flashes, night sweats, weight gain or weight loss Psychological ROS: negative for - anxiety, decreased libido, depression, mood swings, physical abuse or sexual abuse Ophthalmic ROS: negative for - blurry vision, eye pain or loss of vision ENT ROS: negative for - headaches, hearing change, visual changes or vocal changes Allergy  and Immunology ROS: negative for - hives, itchy/watery eyes or seasonal allergies Hematological and Lymphatic ROS: negative for - bleeding problems, bruising, swollen lymph nodes or weight loss Endocrine ROS: negative for - galactorrhea, hair pattern changes, hot flashes, malaise/lethargy, mood swings, palpitations, polydipsia/polyuria, skin changes, temperature intolerance or unexpected weight changes Breast ROS: negative for - new or changing breast lumps or nipple discharge Respiratory ROS: negative for - cough or shortness of breath Cardiovascular ROS: negative for - chest pain, irregular heartbeat, palpitations or shortness of breath Gastrointestinal ROS: no abdominal pain, change in bowel habits, or black or bloody stools Genito-Urinary ROS: no dysuria, trouble voiding, or hematuria Musculoskeletal ROS: negative for - joint pain or joint stiffness Neurological ROS: negative for - bowel and bladder control changes Dermatological ROS: negative for rash and skin lesion changes   Objective:   BP 108/73 mmHg  Pulse 80  Ht 4\' 8"  (1.422 m)  Wt 109 lb 12.8 oz (49.805 kg)  BMI 24.63 kg/m2  LMP 02/01/2014 CONSTITUTIONAL: Well-developed, well-nourished female in no acute distress.  PSYCHIATRIC: Normal mood and affect. Normal behavior. Normal judgment and thought content. [place]: Alert and oriented to person, place, and time. Normal muscle tone coordination. No cranial nerve deficit noted. HENT:  Normocephalic, atraumatic, External right and left ear normal. Oropharynx is clear and moist EYES: Conjunctivae and EOM are normal. Pupils are equal, round, and reactive to light. No scleral icterus.  NECK: Normal range of motion, supple, no masses.  Normal thyroid.  SKIN: Skin is warm and dry. No rash noted. Not diaphoretic. No erythema. No pallor. CARDIOVASCULAR: Normal heart rate noted, regular rhythm, no murmur. RESPIRATORY: Clear to auscultation bilaterally. Effort and breath sounds normal, no  problems with respiration noted. BREASTS: Symmetric in size. No masses, skin changes, nipple drainage, or lymphadenopathy. ABDOMEN: Soft, normal bowel sounds, no distention noted.  No tenderness, rebound or guarding. Well-healed Pfannenstiel incision without evidence of hernia BLADDER: Normal PELVIC:  External Genitalia: Normal; Band-Aid on the left labia majora covering small boil, nonindurated  BUS: Normal  Vagina: Normal; good vault support; good estrogen effect  Cervix: Surgically absent  Uterus: Surgically absent  Adnexa: Normal; nonpalpable and nontender  RV: External Exam NormaI, No Rectal Masses and Normal Sphincter tone  MUSCULOSKELETAL: Normal range of motion. No tenderness.  No cyanosis, clubbing, or edema.  2+ distal pulses. LYMPHATIC: No Axillary, Supraclavicular, or Inguinal Adenopathy.    Assessment:   Annual gynecologic examination 50 y.o. Contraception: status post hysterectomy Normal BMI Problem List Items Addressed This Visit  None    Visit Diagnoses    Well woman exam    -  Primary    Screening for colon cancer        Relevant Orders    Fecal occult blood, imunochemical    Surgical menopause on hormone replacement therapy        Status post total abdominal hysterectomy and bilateral salpingo-oophorectomy (TAH-BSO)           Plan:  Pap: Not needed and Not done Mammogram: Already completed this year-BI-RADS 1 Stool Guaiac Testing:  Ordered Labs: Already completed-normal-primary care Routine preventative health maintenance measures emphasized: Exercise/Diet/Weight control, Tobacco Warnings and Alcohol/Substance use risks Refill estradiol 1 mg daily Return to [place], [place]   Note: This dictation was prepared with Dragon dictation along with smaller phrase technology. Any transcriptional errors that result from this process are unintentional.

## 2016-05-26 NOTE — Progress Notes (Deleted)
  d updated as appropriate: allergies, current medications, past family history, past medical history, past social history, past surgical history and problem list.  Review of Systems ROS Review of Systems - General ROS: negative for - chills, fatigue, fever, hot flashes, night sweats, weight gain or weight loss Psychological ROS: negative for - anxiety, decreased libido, depression, mood swings, physical abuse or sexual abuse Ophthalmic ROS: negative for - blurry vision, eye pain or loss of vision ENT ROS: negative for - headaches, hearing change, visual changes or vocal changes Allergy and Immunology ROS: negative for - hives, itchy/watery eyes or seasonal allergies Hematological and Lymphatic ROS: negative for - bleeding problems, bruising, swollen lymph nodes or weight loss Endocrine ROS: negative for - galactorrhea, hair pattern changes, hot flashes, malaise/lethargy, mood swings, palpitations, polydipsia/polyuria, skin changes, temperature intolerance or unexpected weight changes Breast ROS: negative for - new or changing breast lumps or nipple discharge Respiratory ROS: negative for - cough or shortness of breath Cardiovascular ROS: negative for - chest pain, irregular heartbeat, palpitations or shortness of breath Gastrointestinal ROS: no abdominal pain, change in bowel habits, or black or bloody stools Genito-Urinary ROS: no dysuria, trouble voiding, or hematuria Musculoskeletal ROS: negative for - joint pain or joint stiffness Neurological ROS: negative for - bowel and bladder control changes Dermatological ROS: negative for rash and skin lesion changes   Objective:   BP 108/73 mmHg  Pulse 80  Ht 4\' 8"  (1.422 m)  Wt 109 lb 12.8 oz (49.805 kg)  BMI 24.63 kg/m2  LMP 02/01/2014 CONSTITUTIONAL: Well-developed, well-nourished female in no acute distress.  PSYCHIATRIC: Normal mood and affect. Normal behavior. Normal judgment and thought content. [place]: Alert and oriented to  person, place, and time. Normal muscle tone coordination. No cranial nerve deficit noted. HENT:  Normocephalic, atraumatic, External right and left ear normal. Oropharynx is clear and moist EYES: Conjunctivae and EOM are normal. Pupils are equal, round, and reactive to light. No scleral icterus.  NECK: Normal range of motion, supple, no masses.  Normal thyroid.  SKIN: Skin is warm and dry. No rash noted. Not diaphoretic. No erythema. No pallor. CARDIOVASCULAR: Normal heart rate noted, regular rhythm, no murmur. RESPIRATORY: Clear to auscultation bilaterally. Effort and breath sounds normal, no problems with respiration noted. BREASTS: Symmetric in size. No masses, skin changes, nipple drainage, or lymphadenopathy. ABDOMEN: Soft, normal bowel sounds, no distention noted.  No tenderness, rebound or guarding.  BLADDER: Normal PELVIC:  External Genitalia: Normal  BUS: Normal  Vagina: Normal  Cervix: Normal  Uterus: Normal  Adnexa: Normal  RV: {Blank multiple:19196::"External Exam NormaI","No Rectal Masses","Normal Sphincter tone"}  MUSCULOSKELETAL: Normal range of motion. No tenderness.  No cyanosis, clubbing, or edema.  2+ distal pulses. LYMPHATIC: No Axillary, Supraclavicular, or Inguinal Adenopathy.    Assessment:   Annual gynecologic examination 50 y.o. Contraception: status post hysterectomy Normal BMI Problem List Items Addressed This Visit    None      Plan:  Pap: Not needed Mammogram: done 2017  Stool Guaiac Testing:  Ordered Labs: thru pcp Routine preventative health maintenance measures emphasized: {Blank multiple:19196::"Exercise/Diet/Weight control","Tobacco Warnings","Alcohol/Substance use risks","Stress Management","Peer Pressure Issues","Safe Sex"} *** Return to [place] [facility], [place]

## 2016-06-03 ENCOUNTER — Encounter: Payer: Self-pay | Admitting: Internal Medicine

## 2016-06-05 ENCOUNTER — Other Ambulatory Visit: Payer: Self-pay | Admitting: Obstetrics and Gynecology

## 2016-07-19 ENCOUNTER — Other Ambulatory Visit: Payer: Self-pay

## 2016-08-16 ENCOUNTER — Other Ambulatory Visit: Payer: Self-pay

## 2016-08-16 NOTE — Telephone Encounter (Signed)
Denied. Patient has not been seen since January 2016. She needs an appt for any further refills. She might contact her gynecologist or another provider if she has been seeing someone else for her care.

## 2016-08-17 NOTE — Telephone Encounter (Signed)
Pt.notified

## 2017-03-02 ENCOUNTER — Other Ambulatory Visit: Payer: Self-pay | Admitting: Internal Medicine

## 2017-03-02 DIAGNOSIS — Z1231 Encounter for screening mammogram for malignant neoplasm of breast: Secondary | ICD-10-CM

## 2017-04-20 DIAGNOSIS — Z0000 Encounter for general adult medical examination without abnormal findings: Secondary | ICD-10-CM | POA: Diagnosis not present

## 2017-04-21 DIAGNOSIS — R5381 Other malaise: Secondary | ICD-10-CM | POA: Diagnosis not present

## 2017-04-21 DIAGNOSIS — E784 Other hyperlipidemia: Secondary | ICD-10-CM | POA: Diagnosis not present

## 2017-04-21 DIAGNOSIS — I10 Essential (primary) hypertension: Secondary | ICD-10-CM | POA: Diagnosis not present

## 2017-05-05 ENCOUNTER — Ambulatory Visit
Admission: RE | Admit: 2017-05-05 | Discharge: 2017-05-05 | Disposition: A | Discharge status: Home / Self Care | Payer: BLUE CROSS/BLUE SHIELD | Source: Ambulatory Visit | Attending: Internal Medicine | Admitting: Internal Medicine

## 2017-05-05 DIAGNOSIS — Z1231 Encounter for screening mammogram for malignant neoplasm of breast: Secondary | ICD-10-CM | POA: Diagnosis not present

## 2017-05-11 DIAGNOSIS — G43119 Migraine with aura, intractable, without status migrainosus: Secondary | ICD-10-CM | POA: Diagnosis not present

## 2017-05-11 DIAGNOSIS — J399 Disease of upper respiratory tract, unspecified: Secondary | ICD-10-CM | POA: Diagnosis not present

## 2017-05-11 DIAGNOSIS — J00 Acute nasopharyngitis [common cold]: Secondary | ICD-10-CM | POA: Diagnosis not present

## 2017-05-11 DIAGNOSIS — E8881 Metabolic syndrome: Secondary | ICD-10-CM | POA: Diagnosis not present

## 2017-05-31 ENCOUNTER — Encounter: Payer: Managed Care, Other (non HMO) | Admitting: Obstetrics and Gynecology

## 2017-08-06 DIAGNOSIS — N390 Urinary tract infection, site not specified: Secondary | ICD-10-CM | POA: Diagnosis not present

## 2017-10-31 DIAGNOSIS — E8881 Metabolic syndrome: Secondary | ICD-10-CM | POA: Diagnosis not present

## 2017-10-31 DIAGNOSIS — I10 Essential (primary) hypertension: Secondary | ICD-10-CM | POA: Diagnosis not present

## 2017-10-31 DIAGNOSIS — G43119 Migraine with aura, intractable, without status migrainosus: Secondary | ICD-10-CM | POA: Diagnosis not present

## 2017-12-08 DIAGNOSIS — M6282 Rhabdomyolysis: Secondary | ICD-10-CM | POA: Diagnosis not present

## 2017-12-08 DIAGNOSIS — E8881 Metabolic syndrome: Secondary | ICD-10-CM | POA: Diagnosis not present

## 2017-12-08 DIAGNOSIS — G43119 Migraine with aura, intractable, without status migrainosus: Secondary | ICD-10-CM | POA: Diagnosis not present

## 2017-12-09 DIAGNOSIS — E7849 Other hyperlipidemia: Secondary | ICD-10-CM | POA: Diagnosis not present

## 2017-12-09 DIAGNOSIS — R5381 Other malaise: Secondary | ICD-10-CM | POA: Diagnosis not present

## 2017-12-09 DIAGNOSIS — I10 Essential (primary) hypertension: Secondary | ICD-10-CM | POA: Diagnosis not present

## 2018-01-09 DIAGNOSIS — E8881 Metabolic syndrome: Secondary | ICD-10-CM | POA: Diagnosis not present

## 2018-01-09 DIAGNOSIS — G43119 Migraine with aura, intractable, without status migrainosus: Secondary | ICD-10-CM | POA: Diagnosis not present

## 2018-01-09 DIAGNOSIS — E639 Nutritional deficiency, unspecified: Secondary | ICD-10-CM | POA: Diagnosis not present

## 2018-01-09 DIAGNOSIS — E889 Metabolic disorder, unspecified: Secondary | ICD-10-CM | POA: Diagnosis not present

## 2018-07-06 DIAGNOSIS — E7849 Other hyperlipidemia: Secondary | ICD-10-CM | POA: Diagnosis not present

## 2018-07-06 DIAGNOSIS — E034 Atrophy of thyroid (acquired): Secondary | ICD-10-CM | POA: Diagnosis not present

## 2018-07-06 DIAGNOSIS — I10 Essential (primary) hypertension: Secondary | ICD-10-CM | POA: Diagnosis not present

## 2018-07-06 DIAGNOSIS — R5381 Other malaise: Secondary | ICD-10-CM | POA: Diagnosis not present

## 2018-07-12 ENCOUNTER — Other Ambulatory Visit: Payer: Self-pay | Admitting: Internal Medicine

## 2018-07-12 DIAGNOSIS — E639 Nutritional deficiency, unspecified: Secondary | ICD-10-CM | POA: Diagnosis not present

## 2018-07-12 DIAGNOSIS — M6282 Rhabdomyolysis: Secondary | ICD-10-CM | POA: Diagnosis not present

## 2018-07-12 DIAGNOSIS — R12 Heartburn: Secondary | ICD-10-CM | POA: Diagnosis not present

## 2018-07-12 DIAGNOSIS — E8881 Metabolic syndrome: Secondary | ICD-10-CM | POA: Diagnosis not present

## 2018-07-12 DIAGNOSIS — Z1231 Encounter for screening mammogram for malignant neoplasm of breast: Secondary | ICD-10-CM

## 2018-07-12 DIAGNOSIS — Z0000 Encounter for general adult medical examination without abnormal findings: Secondary | ICD-10-CM | POA: Diagnosis not present

## 2018-07-13 DIAGNOSIS — Z1231 Encounter for screening mammogram for malignant neoplasm of breast: Secondary | ICD-10-CM | POA: Diagnosis not present

## 2018-07-13 DIAGNOSIS — Z1239 Encounter for other screening for malignant neoplasm of breast: Secondary | ICD-10-CM | POA: Diagnosis not present

## 2018-07-24 DIAGNOSIS — R079 Chest pain, unspecified: Secondary | ICD-10-CM | POA: Diagnosis not present

## 2018-07-25 DIAGNOSIS — E785 Hyperlipidemia, unspecified: Secondary | ICD-10-CM | POA: Diagnosis not present

## 2018-07-25 DIAGNOSIS — E639 Nutritional deficiency, unspecified: Secondary | ICD-10-CM | POA: Diagnosis not present

## 2018-07-25 DIAGNOSIS — E8881 Metabolic syndrome: Secondary | ICD-10-CM | POA: Diagnosis not present

## 2018-07-25 DIAGNOSIS — M6282 Rhabdomyolysis: Secondary | ICD-10-CM | POA: Diagnosis not present

## 2018-07-25 DIAGNOSIS — R42 Dizziness and giddiness: Secondary | ICD-10-CM | POA: Diagnosis not present

## 2018-11-21 DIAGNOSIS — K279 Peptic ulcer, site unspecified, unspecified as acute or chronic, without hemorrhage or perforation: Secondary | ICD-10-CM | POA: Diagnosis not present

## 2018-11-21 DIAGNOSIS — E639 Nutritional deficiency, unspecified: Secondary | ICD-10-CM | POA: Diagnosis not present

## 2018-11-21 DIAGNOSIS — E889 Metabolic disorder, unspecified: Secondary | ICD-10-CM | POA: Diagnosis not present

## 2018-11-21 DIAGNOSIS — J399 Disease of upper respiratory tract, unspecified: Secondary | ICD-10-CM | POA: Diagnosis not present

## 2018-11-23 DIAGNOSIS — E889 Metabolic disorder, unspecified: Secondary | ICD-10-CM | POA: Diagnosis not present

## 2018-11-23 DIAGNOSIS — K2990 Gastroduodenitis, unspecified, without bleeding: Secondary | ICD-10-CM | POA: Diagnosis not present

## 2018-11-23 DIAGNOSIS — J399 Disease of upper respiratory tract, unspecified: Secondary | ICD-10-CM | POA: Diagnosis not present

## 2018-11-23 DIAGNOSIS — J00 Acute nasopharyngitis [common cold]: Secondary | ICD-10-CM | POA: Diagnosis not present

## 2018-11-30 ENCOUNTER — Encounter: Payer: Self-pay | Admitting: Gastroenterology

## 2018-11-30 ENCOUNTER — Ambulatory Visit: Payer: BLUE CROSS/BLUE SHIELD | Admitting: Gastroenterology

## 2018-11-30 ENCOUNTER — Encounter

## 2018-11-30 VITALS — BP 119/74 | HR 88 | Ht <= 58 in | Wt 113.8 lb

## 2018-11-30 DIAGNOSIS — R1310 Dysphagia, unspecified: Secondary | ICD-10-CM | POA: Diagnosis not present

## 2018-11-30 DIAGNOSIS — R1013 Epigastric pain: Secondary | ICD-10-CM

## 2018-11-30 NOTE — Progress Notes (Addendum)
Renee Park [place]  [place], [place] [address]  Main: [phone]  Fax: [phone]   Gastroenterology Consultation  Referring Provider:     Cletis [place], MD Primary Care Physician:  [name], MD Primary Gastroenterologist:  Dr. Vonda Park Reason for Consultation:     Abdominal pain        HPI:   Chief complaint: Abdominal pain  Renee Park is a 52 y.o. y/o female referred for consultation & management  by Dr. [name], MD.  Patient reports midepigastric abdominal pain, occurring with or without meals.  Reports chronic history of similar symptoms, with worsening of symptoms recently.  Dull, nonradiating, 5/10.  Reports was given dicyclomine by primary care provider which helped.  And takes it 2-3 times a day.  Was also given sucralfate which she also takes.  Is not sure her sucralfate helps.  Is on omeprazole once daily, and was on AcipHex prior to that.  No prior upper endoscopy.  Reports history of a colonoscopy in 2015 at [facility].  We do not have the records.  States it was normal and was told that she had diverticulosis at that time.  No weight loss.  No dysphagia.  No melena or hematochezia.  Reports chronic history of constipation alternating with loose stools.  States this has not changed.  States her colonoscopy in 2015 was done due to these symptoms.  Past Medical History:  Diagnosis Date  . Abnormal liver enzymes   . Anxiety   . Depression   . Dysmetabolic syndrome X   . Excessive sweating   . GERD (gastroesophageal reflux disease)   . Hyperlipidemia   . Insomnia   . Migraine   . Pelvic pain in female     Past Surgical History:  Procedure Laterality Date  . ABDOMINAL HYSTERECTOMY  2015   tah/bso  . EXCISION VAGINAL CYST    . SHOULDER ARTHROSCOPY Left 2000  . TUBAL LIGATION  1998    Prior to Admission medications   Medication Sig Start Date End Date Taking? Authorizing Provider  ALPRAZolam (XANAX)  0.25 MG tablet TAKE 1 TABLET BY MOUTH TWICE DAILY AS NEEDED. 07/10/15   [name], MD  butalbital-acetaminophen-caffeine (FIORICET, ESGIC) 50-325-40 MG per tablet Take 2 tablets by mouth every 6 (six) hours as needed for headache.    [provider]  estradiol (ESTRACE) 1 MG tablet TAKE 1 TABLET BY MOUTH DAILY 06/07/16   Defrancesco, Alanda Slim, MD  loratadine (CLARITIN) 10 MG tablet Take 10 mg by mouth daily as needed.  05/02/15   [provider]  mometasone (NASONEX) 50 MCG/ACT nasal spray Place 2 sprays into the nose daily.    [provider]  omeprazole (PRILOSEC) 40 MG capsule  02/26/14   [provider]  rosuvastatin (CRESTOR) 10 MG tablet TK 1 T PO QD 02/03/16   [provider]  topiramate (TOPAMAX) 50 MG tablet TAKE 1 TABLET BY MOUTH TWICE DAILY 02/02/16   [name], MD    Family History  Problem Relation Age of Onset  . Cancer Mother        Leukemia  . Hypertension Mother   . Diabetes Father   . Diabetes Paternal Uncle   . Diabetes Paternal Grandfather   . Breast cancer Neg Hx      Social History   Tobacco Use  . Smoking status: Former Smoker    Last attempt to quit: 12/21/2003    Years since quitting: 14.9  .  Smokeless tobacco: Never Used  Substance Use Topics  . Alcohol use: Yes    Alcohol/week: 0.0 standard drinks    Comment: occas.  . Drug use: No    Allergies as of 11/30/2018 - Review Complete 05/26/2016  Allergen Reaction Noted  . Lipitor [atorvastatin]  05/24/2016  . Vytorin [ezetimibe-simvastatin]  05/24/2016    Review of Systems:    All systems reviewed and negative except where noted in HPI.   Physical Exam:  LMP 02/01/2014  Patient's last menstrual period was 02/01/2014.  Vitals:   11/30/18 1538  BP: 119/74  Park: 88  Weight: 113 lb 12.8 oz (51.6 kg)  Height: 4\' 8"  (1.422 m)    Psych:  Alert and cooperative. Normal mood and affect. General:   Alert,  Well-developed, well-nourished, pleasant  and cooperative in NAD Head:  Normocephalic and atraumatic. Eyes:  Sclera clear, no icterus.   Conjunctiva pink. Ears:  Normal auditory acuity. Nose:  No deformity, discharge, or lesions. Mouth:  No deformity or lesions,oropharynx pink & moist. Neck:  Supple; no masses or thyromegaly. Abdomen:  Normal bowel sounds.  No bruits.  Soft, non-tender and non-distended without masses, hepatosplenomegaly or hernias noted.  No guarding or rebound tenderness.    Msk:  Symmetrical without gross deformities. Good, equal movement & strength bilaterally. Pulses:  Normal pulses noted. Extremities:  No clubbing or edema.  No cyanosis. Neurologic:  Alert and oriented x3;  grossly normal neurologically. Skin:  Intact without significant lesions or rashes. No jaundice. Lymph Nodes:  No significant cervical adenopathy. Psych:  Alert and cooperative. Normal mood and affect.   Labs: CBC    Component Value Date/Time   WBC 6.5 04/30/2014 1054   WBC 11.6 (H) 03/06/2014 1120   RBC 4.10 04/30/2014 1054   RBC 4.24 03/06/2014 1120   HGB 9.8 (L) 05/07/2014 0516   HCT 36.5 04/30/2014 1054   PLT 337 04/30/2014 1054   MCV 89 04/30/2014 1054   MCH 30.0 04/30/2014 1054   MCH 28.1 03/06/2014 1120   MCHC 33.7 04/30/2014 1054   MCHC 32.7 03/06/2014 1120   RDW 15.8 (H) 04/30/2014 1054   CMP     Component Value Date/Time   NA 142 04/30/2014 1054   K 3.6 04/30/2014 1054   CL 113 (H) 04/30/2014 1054   CO2 22 04/30/2014 1054   GLUCOSE 88 04/30/2014 1054   BUN 12 04/30/2014 1054   CREATININE 0.73 04/30/2014 1054   CALCIUM 9.0 04/30/2014 1054   GFRNONAA >60 04/30/2014 1054   GFRAA >60 04/30/2014 1054    Imaging Studies: CT abdomen pelvis 2015 with no evidence of bowel obstruction.  Normal appendix.  Mild colonic diverticulosis.  Assessment and Plan:   Renee Park is a 52 y.o. y/o female has been referred for midepigastric abdominal pain  2017 lab work scanned into the chart is the latest lab work  available and shows normal hemoglobin and blood work.  Patient symptoms are chronic, with some worsening recently. We discussed that symptoms may be functional in nature.  Other differentials include GERD, dyspepsia, peptic ulcer disease. We discussed options of conservative management versus proceeding with EGD to rule out peptic ulcer disease and H. Pylori. Patient would like to proceed with EGD for further evaluation.  Since omeprazole has not helped and she is having to take dicyclomine as needed she would like to proceed with EGD.  may need to increase dosage of omeprazole after procedure depending on findings  I have discussed alternative options, risks &  benefits,  which include, but are not limited to, bleeding, infection, perforation,respiratory complication & drug reaction.  The patient agrees with this plan & written consent will be obtained.    We do not have a previous colonoscopy records and she will sign record release for [place] to review these Her bowel movements are at her baseline If her last colonoscopy was not for screening she may need a screening colonoscopy.  We will review records once available. (Addendum: Colonoscopy October 2015 with Dr. Zipporah Plants for diarrhea.  [place] reported, otherwise normal.  Good prep.  Extent of exam cecum. EGD October 2015 normal with duodenal biopsies taken for evaluation of celiac disease. Biopsies reported unremarkable small intestinal mucosa, no evidence of celiac sprue.  Random colon biopsies were unremarkable as well.  Repeat colonoscopy was recommended in 10 years)  Dr Renee Park  Speech recognition software was used to dictate the above note.

## 2018-12-04 ENCOUNTER — Telehealth: Payer: Self-pay | Admitting: Gastroenterology

## 2018-12-04 ENCOUNTER — Encounter: Payer: Self-pay | Admitting: Gastroenterology

## 2018-12-04 DIAGNOSIS — E785 Hyperlipidemia, unspecified: Secondary | ICD-10-CM | POA: Diagnosis not present

## 2018-12-04 DIAGNOSIS — J00 Acute nasopharyngitis [common cold]: Secondary | ICD-10-CM | POA: Diagnosis not present

## 2018-12-04 DIAGNOSIS — K2990 Gastroduodenitis, unspecified, without bleeding: Secondary | ICD-10-CM | POA: Diagnosis not present

## 2018-12-04 DIAGNOSIS — G43119 Migraine with aura, intractable, without status migrainosus: Secondary | ICD-10-CM | POA: Diagnosis not present

## 2018-12-04 NOTE — Telephone Encounter (Signed)
Pt is calling  To reschedule her procedure for 12/06/18 due to having a sinus infection please call her cell to reschedule

## 2018-12-04 NOTE — Telephone Encounter (Signed)
Pt rescheduled to 01/03/2019 due to illness. Pt has sinus infection.

## 2018-12-04 NOTE — Telephone Encounter (Signed)
Pt is calling back to make sure everyone is aware that she is canceling her appt on 12/06/18. Pls cancel an call pt

## 2018-12-21 ENCOUNTER — Ambulatory Visit: Payer: No Typology Code available for payment source | Admitting: Gastroenterology

## 2019-01-02 ENCOUNTER — Encounter: Payer: Self-pay | Admitting: Student

## 2019-01-03 ENCOUNTER — Ambulatory Visit: Payer: BLUE CROSS/BLUE SHIELD | Admitting: Anesthesiology

## 2019-01-03 ENCOUNTER — Encounter: Payer: Self-pay | Admitting: *Deleted

## 2019-01-03 ENCOUNTER — Ambulatory Visit
Admission: RE | Admit: 2019-01-03 | Discharge: 2019-01-03 | Disposition: A | Discharge status: Home / Self Care | Payer: BLUE CROSS/BLUE SHIELD | Attending: Gastroenterology | Admitting: Gastroenterology

## 2019-01-03 ENCOUNTER — Encounter
Admission: RE | Disposition: A | Discharge status: Home / Self Care | Payer: Self-pay | Source: Home / Self Care | Attending: Gastroenterology

## 2019-01-03 DIAGNOSIS — K219 Gastro-esophageal reflux disease without esophagitis: Secondary | ICD-10-CM | POA: Diagnosis not present

## 2019-01-03 DIAGNOSIS — E785 Hyperlipidemia, unspecified: Secondary | ICD-10-CM | POA: Diagnosis not present

## 2019-01-03 DIAGNOSIS — R1013 Epigastric pain: Secondary | ICD-10-CM

## 2019-01-03 DIAGNOSIS — F329 Major depressive disorder, single episode, unspecified: Secondary | ICD-10-CM | POA: Diagnosis not present

## 2019-01-03 DIAGNOSIS — G4700 Insomnia, unspecified: Secondary | ICD-10-CM | POA: Insufficient documentation

## 2019-01-03 DIAGNOSIS — F419 Anxiety disorder, unspecified: Secondary | ICD-10-CM | POA: Diagnosis not present

## 2019-01-03 DIAGNOSIS — R1310 Dysphagia, unspecified: Secondary | ICD-10-CM

## 2019-01-03 DIAGNOSIS — K2950 Unspecified chronic gastritis without bleeding: Secondary | ICD-10-CM | POA: Insufficient documentation

## 2019-01-03 DIAGNOSIS — Z87891 Personal history of nicotine dependence: Secondary | ICD-10-CM | POA: Diagnosis not present

## 2019-01-03 DIAGNOSIS — Z79899 Other long term (current) drug therapy: Secondary | ICD-10-CM | POA: Insufficient documentation

## 2019-01-03 DIAGNOSIS — F418 Other specified anxiety disorders: Secondary | ICD-10-CM | POA: Diagnosis not present

## 2019-01-03 HISTORY — PX: ESOPHAGOGASTRODUODENOSCOPY (EGD) WITH PROPOFOL: SHX5813

## 2019-01-03 SURGERY — ESOPHAGOGASTRODUODENOSCOPY (EGD) WITH PROPOFOL
Anesthesia: General

## 2019-01-03 MED ORDER — FENTANYL CITRATE (PF) 100 MCG/2ML IJ SOLN
INTRAMUSCULAR | Status: AC
  Filled 2019-01-03: qty 2

## 2019-01-03 MED ORDER — LIDOCAINE HCL (CARDIAC) PF 100 MG/5ML IV SOSY
PREFILLED_SYRINGE | INTRAVENOUS | Status: DC | PRN
  Administered 2019-01-03: 50 mg via INTRAVENOUS

## 2019-01-03 MED ORDER — LIDOCAINE HCL (PF) 2 % IJ SOLN
INTRAMUSCULAR | Status: AC
  Filled 2019-01-03: qty 10

## 2019-01-03 MED ORDER — ONDANSETRON HCL 4 MG/2ML IJ SOLN
INTRAMUSCULAR | Status: AC
  Filled 2019-01-03: qty 2

## 2019-01-03 MED ORDER — SODIUM CHLORIDE 0.9 % IV SOLN
INTRAVENOUS | Status: DC
  Administered 2019-01-03: 10:00:00 via INTRAVENOUS

## 2019-01-03 MED ORDER — FENTANYL CITRATE (PF) 100 MCG/2ML IJ SOLN
INTRAMUSCULAR | Status: DC | PRN
  Administered 2019-01-03: 50 ug via INTRAVENOUS

## 2019-01-03 MED ORDER — PROPOFOL 10 MG/ML IV BOLUS
INTRAVENOUS | Status: DC | PRN
  Administered 2019-01-03 (×2): 10 mg via INTRAVENOUS
  Administered 2019-01-03: 40 mg via INTRAVENOUS
  Administered 2019-01-03: 20 mg via INTRAVENOUS
  Administered 2019-01-03 (×3): 10 mg via INTRAVENOUS

## 2019-01-03 MED ORDER — GLYCOPYRROLATE 0.2 MG/ML IJ SOLN
INTRAMUSCULAR | Status: AC
  Filled 2019-01-03: qty 1

## 2019-01-03 MED ORDER — GLYCOPYRROLATE 0.2 MG/ML IJ SOLN
INTRAMUSCULAR | Status: DC | PRN
  Administered 2019-01-03: 0.1 mg via INTRAVENOUS

## 2019-01-03 MED ORDER — ONDANSETRON HCL 4 MG/2ML IJ SOLN
INTRAMUSCULAR | Status: DC | PRN
  Administered 2019-01-03: 4 mg via INTRAVENOUS

## 2019-01-03 MED ORDER — PROPOFOL 10 MG/ML IV BOLUS
INTRAVENOUS | Status: AC
  Filled 2019-01-03: qty 20

## 2019-01-03 NOTE — H&P (Signed)
[name], MD [address] [place], [place], [place], [address] [address], [place], [place], [place], [address] Phone: [phone]  Fax: [phone]  Primary Care Physician:  Cletis Athens, MD   Pre-Procedure History & Physical: HPI:  Renee Park is a 53 y.o. female is here for an EGD.   Past Medical History:  Diagnosis Date  . Abnormal liver enzymes   . Anxiety   . Depression   . Dysmetabolic syndrome X   . Excessive sweating   . GERD (gastroesophageal reflux disease)   . Hyperlipidemia   . Insomnia   . Migraine   . Pelvic pain in female     Past Surgical History:  Procedure Laterality Date  . ABDOMINAL HYSTERECTOMY  2015   tah/bso  . EXCISION VAGINAL CYST    . SHOULDER ARTHROSCOPY Left 2000  . TUBAL LIGATION  1998    Prior to Admission medications   Medication Sig Start Date End Date Taking? Authorizing Provider  ALPRAZolam (XANAX) 0.25 MG tablet TAKE 1 TABLET BY MOUTH TWICE DAILY AS NEEDED. 07/10/15  Yes [name], MD  aspirin EC 81 MG tablet Take 81 mg by mouth daily.   Yes [provider]  dicyclomine (BENTYL) 10 MG capsule Take 10 mg by mouth daily.   Yes [provider]  Evolocumab (REPATHA [place]) Inject 140 mg into the skin every 21 ( twenty-one) days. Every 2 weeks   Yes [provider]  ezetimibe (ZETIA) 10 MG tablet Take 10 mg by mouth daily.   Yes [provider]  omeprazole (PRILOSEC) 40 MG capsule  02/26/14  Yes [provider]  rosuvastatin (CRESTOR) 10 MG tablet TK 1 T PO QD 02/03/16  Yes [provider]  sucralfate (CARAFATE) 1 g tablet Take 1 g by mouth 3 (three) times daily.   Yes [provider]  topiramate (TOPAMAX) 50 MG tablet TAKE 1 TABLET BY MOUTH TWICE DAILY 02/02/16  Yes [name], MD  butalbital-acetaminophen-caffeine (FIORICET, ESGIC) 50-325-40 MG per tablet Take 2 tablets by mouth every 6 (six) hours as needed for headache.    [provider]    Allergies as of 12/01/2018 - Review Complete 12/01/2018  Allergen Reaction Noted  . Lipitor [atorvastatin]  05/24/2016  . Vytorin [ezetimibe-simvastatin]  05/24/2016    Family History  Problem Relation Age of Onset  . Cancer Mother        Leukemia  . Hypertension Mother   . Diabetes Father   . Diabetes Paternal Uncle   . Diabetes Paternal Grandfather   . Breast cancer Neg Hx     Social History   Socioeconomic History  . Marital status: Married    Spouse name: Not on file  . Number of children: Not on file  . Years of education: Not on file  . Highest education level: Not on file  Occupational History  . Not on file  Social Needs  . Financial resource strain: Not on file  . Food insecurity:    Worry: Not on file    Inability: Not on file  . Transportation needs:    Medical: Not on file    Non-medical: Not on file  Tobacco Use  . Smoking status: Former Smoker    Last attempt to quit: 12/21/2003    Years since quitting: 15.0  . Smokeless tobacco: Never Used  Substance and Sexual Activity  . Alcohol use: Yes    Alcohol/week: 0.0 standard drinks    Comment: occas.  Marland Kitchen  Drug use: No  . Sexual activity: Yes    Partners: Male    Birth control/protection: Surgical  Lifestyle  . Physical activity:    Days per week: Not on file    Minutes per session: Not on file  . Stress: Not on file  Relationships  . Social connections:    Talks on phone: Not on file    Gets together: Not on file    Attends religious service: Not on file    Active member of club or organization: Not on file    Attends meetings of clubs or organizations: Not on file    Relationship status: Not on file  . Intimate partner violence:    Fear of current or ex partner: Not on file    Emotionally abused: Not on file    Physically abused: Not on file    Forced sexual activity: Not on file  Other Topics Concern  . Not on file  Social History Narrative  . Not on file    Review of  Systems: See HPI, otherwise negative ROS  Physical Exam: BP 111/64   Pulse 66   Temp (!) 97 F (36.1 C) (Tympanic)   Resp 16   Ht 4\' 8"  (1.422 m)   Wt 51.7 kg   LMP 02/01/2014   SpO2 99%   BMI 25.56 kg/m  General:   Alert,  pleasant and cooperative in NAD Head:  Normocephalic and atraumatic. Neck:  Supple; no masses or thyromegaly. Lungs:  Clear throughout to auscultation, normal respiratory effort.    Heart:  +S1, +S2, Regular rate and rhythm, No edema. Abdomen:  Soft, nontender and nondistended. Normal bowel sounds, without guarding, and without rebound.   Neurologic:  Alert and  oriented x4;  grossly normal neurologically.  Impression/Plan: Renee Park is here for an EGD for abdominal pain  Risks, benefits, limitations, and alternatives regarding the procedure have been reviewed with the patient.  Questions have been answered.  All parties agreeable.   Virgel Manifold, MD  01/03/2019, 10:39 AM

## 2019-01-03 NOTE — Anesthesia Post-op Follow-up Note (Signed)
Anesthesia QCDR form completed.        

## 2019-01-03 NOTE — Transfer of Care (Signed)
Immediate Anesthesia Transfer of Care Note  Patient: Renee Park  Procedure(s) Performed: ESOPHAGOGASTRODUODENOSCOPY (EGD) WITH PROPOFOL (N/A )  Patient Location: PACU  Anesthesia Type:General  Level of Consciousness: awake, alert  and oriented  Airway & Oxygen Therapy: Patient Spontanous Breathing and Patient connected to nasal cannula oxygen  Post-op Assessment: Report given to RN and Post -op Vital signs reviewed and stable  Post vital signs: Reviewed and stable  Last Vitals:  Vitals Value Taken Time  BP 111/64 01/03/2019 10:36 AM  Temp 36.1 C 01/03/2019 10:36 AM  Pulse 65 01/03/2019 10:39 AM  Resp 18 01/03/2019 10:39 AM  SpO2 98 % 01/03/2019 10:39 AM  Vitals shown include unvalidated device data.  Last Pain:  Vitals:   01/03/19 1036  TempSrc: Tympanic  PainSc: 0-No pain         Complications: No apparent anesthesia complications

## 2019-01-03 NOTE — Anesthesia Preprocedure Evaluation (Signed)
Anesthesia Evaluation  Patient identified by MRN, date of birth, ID band Patient awake    Reviewed: Allergy & Precautions, H&P , NPO status , Patient's Chart, lab work & pertinent test results  History of Anesthesia Complications Negative for: history of anesthetic complications  Airway Mallampati: II  TM Distance: >3 FB Neck ROM: full    Dental  (+) Chipped   Pulmonary neg pulmonary ROS, neg shortness of breath, former smoker,           Cardiovascular Exercise Tolerance: Good (-) angina(-) Past MI and (-) DOE negative cardio ROS       Neuro/Psych  Headaches, PSYCHIATRIC DISORDERS    GI/Hepatic Neg liver ROS, GERD  Medicated and Controlled,  Endo/Other  negative endocrine ROS  Renal/GU negative Renal ROS  negative genitourinary   Musculoskeletal   Abdominal   Peds  Hematology negative hematology ROS (+)   Anesthesia Other Findings Past Medical History: No date: Abnormal liver enzymes No date: Anxiety No date: Depression No date: Dysmetabolic syndrome X No date: Excessive sweating No date: GERD (gastroesophageal reflux disease) No date: Hyperlipidemia No date: Insomnia No date: Migraine No date: Pelvic pain in female  Past Surgical History: 2015: ABDOMINAL HYSTERECTOMY     Comment:  tah/bso No date: EXCISION VAGINAL CYST 2000: SHOULDER ARTHROSCOPY; Left 1998: TUBAL LIGATION  BMI    Body Mass Index:  25.56 kg/m      Reproductive/Obstetrics negative OB ROS                             Anesthesia Physical Anesthesia Plan  ASA: III  Anesthesia Plan: General   Post-op Pain Management:    Induction: Intravenous  PONV Risk Score and Plan: Propofol infusion and TIVA  Airway Management Planned: Natural Airway and Nasal Cannula  Additional Equipment:   Intra-op Plan:   Post-operative Plan:   Informed Consent: I have reviewed the patients History and Physical, chart,  labs and discussed the procedure including the risks, benefits and alternatives for the proposed anesthesia with the patient or authorized representative who has indicated his/her understanding and acceptance.     Dental Advisory Given  Plan Discussed with: Anesthesiologist, CRNA and Surgeon  Anesthesia Plan Comments: (Patient consented for risks of anesthesia including but not limited to:  - adverse reactions to medications - risk of intubation if required - damage to teeth, lips or other oral mucosa - sore throat or hoarseness - Damage to heart, brain, lungs or loss of life  Patient voiced understanding.)        Anesthesia Quick Evaluation

## 2019-01-03 NOTE — Anesthesia Postprocedure Evaluation (Signed)
Anesthesia Post Note  Patient: Renee Park  Procedure(s) Performed: ESOPHAGOGASTRODUODENOSCOPY (EGD) WITH PROPOFOL (N/A )  Patient location during evaluation: Endoscopy Anesthesia Type: General Level of consciousness: awake and alert Pain management: pain level controlled Vital Signs Assessment: post-procedure vital signs reviewed and stable Respiratory status: spontaneous breathing, nonlabored ventilation, respiratory function stable and patient connected to nasal cannula oxygen Cardiovascular status: blood pressure returned to baseline and stable Postop Assessment: no apparent nausea or vomiting Anesthetic complications: no     Last Vitals:  Vitals:   01/03/19 1046 01/03/19 1056  BP: 117/62 117/64  Pulse: 73 69  Resp: (!) 21 (!) 22  Temp:    SpO2: 99% 100%    Last Pain:  Vitals:   01/03/19 1056  TempSrc:   PainSc: 0-No pain                 Precious Haws Dvante Hands

## 2019-01-03 NOTE — Op Note (Signed)
[facility] Gastroenterology Patient Name: Renee Park Procedure Date: 01/03/2019 10:08 AM MRN: [id number] Account #: [id number] Date of Birth: [birth year] Admit Type: Outpatient Age: 53 Room: [facility] ENDO ROOM 4 Gender: Female Note Status: Finalized Procedure:            Upper GI endoscopy Indications:          Epigastric abdominal pain Providers:            Kaloni Bisaillon B. Bonna Gains MD, MD Medicines:            Monitored Anesthesia Care Complications:        No immediate complications. Procedure:            Pre-Anesthesia Assessment:                       - Prior to the procedure, a History and Physical was                        performed, and patient medications, allergies and                        sensitivities were reviewed. The patient's tolerance of                        previous anesthesia was reviewed.                       - The risks and benefits of the procedure and the                        sedation options and risks were discussed with the                        patient. All questions were answered and informed                        consent was obtained.                       - Patient identification and proposed procedure were                        verified prior to the procedure by the physician, the                        nurse, the anesthesiologist, the anesthetist and the                        technician. The procedure was verified in the procedure                        room.                       - ASA Grade Assessment: II - A patient with mild                        systemic disease.                       After obtaining informed consent, the endoscope was  passed under direct vision. Throughout the procedure,                        the patient's blood pressure, pulse, and oxygen                        saturations were monitored continuously. The Endoscope                        was introduced through the mouth, and  advanced to the                        third part of duodenum. The upper GI endoscopy was                        accomplished with ease. The patient tolerated the                        procedure well. Findings:      The gastroesophageal junction and examined esophagus were normal.      The entire examined stomach was normal. Biopsies were obtained in the       gastric body, at the incisura and in the gastric antrum with cold       forceps for histology. Biopsies were taken with a cold forceps for       Helicobacter pylori testing.      The duodenal bulb, second portion of the duodenum and examined duodenum       were normal. Impression:           - Normal gastroesophageal junction and esophagus.                       - Normal stomach. Biopsied.                       - Normal duodenal bulb, second portion of the duodenum                        and examined duodenum.                       - Biopsies were obtained in the gastric body, at the                        incisura and in the gastric antrum. Recommendation:       - Await pathology results.                       - Discharge patient to home (with escort).                       - Advance diet as tolerated.                       - Continue present medications.                       - Patient has a contact number available for                        emergencies. The signs and symptoms of potential  delayed complications were discussed with the patient.                        Return to normal activities tomorrow. Written discharge                        instructions were provided to the patient.                       - Discharge patient to home (with escort).                       - The findings and recommendations were discussed with                        the patient.                       - The findings and recommendations were discussed with                        the patient's family. Procedure Code(s):     --- Professional ---                       [phone], Esophagogastroduodenoscopy, flexible, transoral;                        with biopsy, single or multiple Diagnosis Code(s):    --- Professional ---                       R10.13, Epigastric pain CPT copyright 2018 American Medical Association. All rights reserved. The codes documented in this report are preliminary and upon coder review may  be revised to meet current compliance requirements.  [name], MD Margretta Sidle B. Bonna Gains MD, MD 01/03/2019 10:36:58 AM This report has been signed electronically. Number of Addenda: 0 Note Initiated On: 01/03/2019 10:08 AM Estimated Blood Loss: Estimated blood loss: none.      [facility]

## 2019-01-04 ENCOUNTER — Encounter: Payer: Self-pay | Admitting: Gastroenterology

## 2019-01-04 ENCOUNTER — Ambulatory Visit: Payer: No Typology Code available for payment source | Admitting: Gastroenterology

## 2019-01-04 LAB — SURGICAL PATHOLOGY

## 2019-01-16 ENCOUNTER — Ambulatory Visit: Payer: BLUE CROSS/BLUE SHIELD | Admitting: Gastroenterology

## 2019-01-16 ENCOUNTER — Encounter: Payer: Self-pay | Admitting: Gastroenterology

## 2019-01-16 ENCOUNTER — Encounter

## 2019-01-16 VITALS — BP 144/79 | HR 88 | Ht <= 58 in | Wt 114.0 lb

## 2019-01-16 DIAGNOSIS — K219 Gastro-esophageal reflux disease without esophagitis: Secondary | ICD-10-CM | POA: Diagnosis not present

## 2019-01-16 DIAGNOSIS — R748 Abnormal levels of other serum enzymes: Secondary | ICD-10-CM | POA: Diagnosis not present

## 2019-01-16 DIAGNOSIS — R1013 Epigastric pain: Secondary | ICD-10-CM

## 2019-01-16 DIAGNOSIS — K224 Dyskinesia of esophagus: Secondary | ICD-10-CM

## 2019-01-16 NOTE — Progress Notes (Signed)
Renee Park, Renee Park [address]  [place]  [place], [place] [address]  Main: [phone]  Fax: [phone]   Primary Care Physician: Renee Park, Renee Park  CC: Abdominal pain   HPI: Renee Park is a 53 y.o. female follow-up of abdominal pain.  Patient underwent EGD on January 03, 2027 was normal.  Gastric biopsies were negative for H. pylori but showed focal intestinal metaplasia.  Patient is on omeprazole 20 mg that she definitely takes once daily and on several days of the week will take it twice daily due to severe symptoms.  Report midepigastric abdominal pain, occurring without meals, dull, nonradiating, 5/10.  No dysphagia.  No weight loss.  However, states symptoms are severe and occur even if she is not stressed.  No recent stressors.  States has been struggling with this for a while and has been on multiple PPIs in the past.  Sometimes symptoms feel like chest pain instead of midepigastric pain and states she has had cardiac work-up (stress test for this in the past that has been negative.  Previous procedures: Colonoscopy October 2015 with Dr. Zipporah Plants for diarrhea.  [place] reported, otherwise normal. Good prep.  Extent of exam cecum.  EGD October 2015 normal with duodenal biopsies taken for evaluation of celiac disease. Biopsies reported unremarkable small intestinal mucosa, no evidence of celiac sprue.  Random colon biopsies were unremarkable as well.  Repeat colonoscopy was recommended in 10 years  Current Outpatient Medications  Medication Sig Dispense Refill  . ALPRAZolam (XANAX) 0.25 MG tablet TAKE 1 TABLET BY MOUTH TWICE DAILY AS NEEDED. 40 tablet 2  . aspirin EC 81 MG tablet Take 81 mg by mouth daily.    . butalbital-acetaminophen-caffeine (FIORICET, ESGIC) 50-325-40 MG per tablet Take 2 tablets by mouth every 6 (six) hours as needed for headache.    . dicyclomine (BENTYL) 10 MG capsule Take 10 mg by mouth daily.    . Evolocumab (REPATHA [place]) Inject  140 mg into the skin every 21 ( twenty-one) days. Every 2 weeks    . ezetimibe (ZETIA) 10 MG tablet Take 10 mg by mouth daily.    Marland Kitchen omeprazole (PRILOSEC) 40 MG capsule     . rosuvastatin (CRESTOR) 10 MG tablet TK 1 T PO QD  4  . sucralfate (CARAFATE) 1 g tablet Take 1 g by mouth 3 (three) times daily.    Marland Kitchen topiramate (TOPAMAX) 50 MG tablet TAKE 1 TABLET BY MOUTH TWICE DAILY 180 tablet 1   No current facility-administered medications for this visit.     Allergies as of 01/16/2019 - Review Complete 01/03/2019  Allergen Reaction Noted  . Lipitor [atorvastatin]  05/24/2016  . Vytorin [ezetimibe-simvastatin]  05/24/2016    ROS:  General: Negative for anorexia, weight loss, fever, chills, fatigue, weakness. ENT: Negative for hoarseness, difficulty swallowing , nasal congestion. CV: Negative for chest pain, angina, palpitations, dyspnea on exertion, peripheral edema.  Respiratory: Negative for dyspnea at rest, dyspnea on exertion, cough, sputum, wheezing.  GI: See history of present illness. GU:  Negative for dysuria, hematuria, urinary incontinence, urinary frequency, nocturnal urination.  Endo: Negative for unusual weight change.    Physical Examination: Vitals:   01/16/19 1315  BP: (!) 144/79  Pulse: 88  Weight: 114 lb (51.7 kg)  Height: 4\' 8"  (1.422 m)     General: Well-nourished, well-developed in no acute distress.  Eyes: No icterus. Conjunctivae pink. Mouth: Oropharyngeal mucosa moist and pink , no lesions erythema or exudate. Neck: Supple, Trachea  midline Abdomen: Bowel sounds are normal, nontender, nondistended, no hepatosplenomegaly or masses, no abdominal bruits or hernia , no rebound or guarding.   Extremities: No lower extremity edema. No clubbing or deformities. Neuro: Alert and oriented x 3.  Grossly intact. Skin: Warm and dry, no jaundice.   Psych: Alert and cooperative, normal mood and affect.   Labs: CMP     Component Value Date/Time   NA 142 04/30/2014  1054   K 3.6 04/30/2014 1054   CL 113 (H) 04/30/2014 1054   CO2 22 04/30/2014 1054   GLUCOSE 88 04/30/2014 1054   BUN 12 04/30/2014 1054   CREATININE 0.73 04/30/2014 1054   CALCIUM 9.0 04/30/2014 1054   GFRNONAA >60 04/30/2014 1054   GFRAA >60 04/30/2014 1054   Lab Results  Component Value Date   WBC 6.5 04/30/2014   HGB 9.8 (L) 05/07/2014   HCT 36.5 04/30/2014   MCV 89 04/30/2014   PLT 337 04/30/2014    Imaging Studies: CT abdomen November 2015 showed diverticulosis and no CT findings to account for patient's abdominal pain at the time  Assessment and Plan:   Renee Park is a 53 y.o. y/o female here for follow-up of abdominal pain with reassuring work-up thus far  Given her severity of symptoms and her report of noncardiac chest pain at times, I will refer her for manometry to evaluate for esophageal spasm and pH monitoring to evaluate for GERD  If this is negative, we can consider CT scan and if all the tests are negative patient may have functional abdominal pain.  2017 labs scanned into the chart are reassuring.  Will repeat CBC and CMP at this time.  Due to finding of intestinal metaplasia seen on EGD with biopsies, repeat EGD for gastric mapping biopsies as indicated.  We will first obtain manometry and pH monitoring and then EGD in the next 6 to 12 months for gastric mapping biopsies.  Patient is agreeable with the above plan  Dr Renee Park

## 2019-01-16 NOTE — Patient Instructions (Addendum)
Manometry and pH impendence study on 02/01/2019 at [facility]. Go to the medical mall entrance on arrival, take your ID and insurance card Contact [facility] the day prior to procedure at [phone] between 1-3 pm for your time. Have labs done by Dr. Lavera Guise if possible, if not contact of office.

## 2019-01-17 ENCOUNTER — Telehealth: Payer: Self-pay | Admitting: Gastroenterology

## 2019-01-17 ENCOUNTER — Telehealth: Payer: Self-pay

## 2019-01-17 LAB — COMPREHENSIVE METABOLIC PANEL
A/G RATIO: 2 (ref 1.2–2.2)
ALT: 38 IU/L — ABNORMAL HIGH (ref 0–32)
AST: 27 IU/L (ref 0–40)
Albumin: 4.8 g/dL (ref 3.8–4.9)
Alkaline Phosphatase: 129 IU/L — ABNORMAL HIGH (ref 39–117)
BUN/Creatinine Ratio: 21 (ref 9–23)
BUN: 16 mg/dL (ref 6–24)
Bilirubin Total: 0.2 mg/dL (ref 0.0–1.2)
CO2: 23 mmol/L (ref 20–29)
Calcium: 9.8 mg/dL (ref 8.7–10.2)
Chloride: 105 mmol/L (ref 96–106)
Creatinine, Ser: 0.75 mg/dL (ref 0.57–1.00)
GFR calc Af Amer: 105 mL/min/{1.73_m2} (ref >59)
GFR, EST NON AFRICAN AMERICAN: 91 mL/min/{1.73_m2} (ref >59)
Globulin, Total: 2.4 g/dL (ref 1.5–4.5)
Glucose: 115 mg/dL — ABNORMAL HIGH (ref 65–99)
Potassium: 4 mmol/L (ref 3.5–5.2)
SODIUM: 144 mmol/L (ref 134–144)
Total Protein: 7.2 g/dL (ref 6.0–8.5)

## 2019-01-17 LAB — CBC WITH DIFFERENTIAL/PLATELET
Basophils Absolute: 0 10*3/uL (ref 0.0–0.2)
Basos: 1 %
EOS (ABSOLUTE): 0.1 10*3/uL (ref 0.0–0.4)
Eos: 2 %
HEMATOCRIT: 40.3 % (ref 34.0–46.6)
HEMOGLOBIN: 13.7 g/dL (ref 11.1–15.9)
IMMATURE GRANS (ABS): 0 10*3/uL (ref 0.0–0.1)
Immature Granulocytes: 0 %
Lymphocytes Absolute: 2.2 10*3/uL (ref 0.7–3.1)
Lymphs: 34 %
MCH: 30.2 pg (ref 26.6–33.0)
MCHC: 34 g/dL (ref 31.5–35.7)
MCV: 89 fL (ref 79–97)
MONOCYTES: 9 %
Monocytes Absolute: 0.6 10*3/uL (ref 0.1–0.9)
Neutrophils Absolute: 3.6 10*3/uL (ref 1.4–7.0)
Neutrophils: 54 %
Platelets: 343 10*3/uL (ref 150–450)
RBC: 4.54 x10E6/uL (ref 3.77–5.28)
RDW: 12.3 % (ref 11.7–15.4)
WBC: 6.5 10*3/uL (ref 3.4–10.8)

## 2019-01-17 NOTE — Telephone Encounter (Signed)
Pt calls inquiring about the CT scan that was discussed at her visit yesterday. She would like to go ahead and get this done if you deem this appropriate. In your notes you stated if manometry was negative the CT scan would be ordered. I inform pt of this. She wants to find out what is going on and feel better. Her manometry is scheduled for 02/01/19. Due to their schedule and a death in the family, this was the nearest date. She also asked if we could check on the cost of the CT but I did inform her that we get it approved by her insurance but the cost she would need to find out through her insurance. Pt appreciative.  Please advise.

## 2019-01-17 NOTE — Telephone Encounter (Signed)
STEVEN from  [facility] calling to get Procedure and  the diagnosis code on patients upcoming procedures please call  [phone] ext [phone]

## 2019-01-17 NOTE — Addendum Note (Signed)
Addended by: [name] on: 01/17/2019 09:44 AM   Modules accepted: Orders

## 2019-01-17 NOTE — Telephone Encounter (Signed)
errow

## 2019-01-17 NOTE — Addendum Note (Signed)
Addended by: Earl Lagos on: 01/17/2019 09:38 AM   Modules accepted: Orders

## 2019-01-18 ENCOUNTER — Other Ambulatory Visit: Payer: Self-pay

## 2019-01-18 ENCOUNTER — Telehealth: Payer: Self-pay

## 2019-01-18 DIAGNOSIS — R748 Abnormal levels of other serum enzymes: Secondary | ICD-10-CM | POA: Diagnosis not present

## 2019-01-18 NOTE — Telephone Encounter (Signed)
-----   Message from Virgel Manifold, MD sent at 01/17/2019  9:45 AM EST ----- Jackelyn Poling please let patient know, her liver enzymes are slightly elevated. She will need a RUQ U/S for elevated liver enzymes. I have also ordered more blood work to test for hepatitis. Please tell her this is just slight elevation, not worrisone, but we just need to do further workup to evaluate her liver.

## 2019-01-18 NOTE — Telephone Encounter (Signed)
Pt notified of need for ultrasound RUQ for elevated liver enzymes. Ultrasound scheduled for Feb. 5th at 10 am; arrival time 9:45 am at the medical mall registration desk. NPO after midnight. Pt given phone number to reschedule if needed ([phone]). To come by office and do labs.

## 2019-01-19 LAB — HEPATITIS A ANTIBODY, TOTAL: HEP A TOTAL AB: NEGATIVE

## 2019-01-19 LAB — HEPATITIS B CORE ANTIBODY, IGM: Hep B C IgM: NEGATIVE

## 2019-01-19 LAB — HEPATITIS C ANTIBODY (REFLEX): HCV Ab: <0.1 s/co ratio (ref 0.0–0.9)

## 2019-01-19 LAB — HEPATITIS A ANTIBODY, IGM: Hep A IgM: NEGATIVE

## 2019-01-19 LAB — HEPATITIS B CORE ANTIBODY, TOTAL: Hep B Core Total Ab: NEGATIVE

## 2019-01-19 LAB — HEPATITIS B SURFACE ANTIBODY,QUALITATIVE: HEP B SURFACE AB, QUAL: REACTIVE

## 2019-01-19 LAB — CERULOPLASMIN: Ceruloplasmin: 28.9 mg/dL (ref 19.0–39.0)

## 2019-01-19 LAB — HCV COMMENT:

## 2019-01-19 LAB — HEPATITIS B SURFACE ANTIGEN: Hepatitis B Surface Ag: NEGATIVE

## 2019-01-19 LAB — GAMMA GT: GGT: 351 IU/L — ABNORMAL HIGH (ref 0–60)

## 2019-01-19 LAB — FERRITIN: Ferritin: 101 ng/mL (ref 15–150)

## 2019-01-21 LAB — MITOCHONDRIAL/SMOOTH MUSCLE AB PNL
Mitochondrial Ab: <20 Units (ref 0.0–20.0)
Smooth Muscle Ab: 3 Units (ref 0–19)

## 2019-01-24 ENCOUNTER — Ambulatory Visit
Admission: RE | Admit: 2019-01-24 | Discharge: 2019-01-24 | Disposition: A | Discharge status: Home / Self Care | Payer: BLUE CROSS/BLUE SHIELD | Source: Ambulatory Visit | Attending: Gastroenterology | Admitting: Gastroenterology

## 2019-01-24 DIAGNOSIS — R7989 Other specified abnormal findings of blood chemistry: Secondary | ICD-10-CM | POA: Diagnosis not present

## 2019-01-24 DIAGNOSIS — R748 Abnormal levels of other serum enzymes: Secondary | ICD-10-CM | POA: Insufficient documentation

## 2019-02-01 ENCOUNTER — Encounter
Admission: RE | Disposition: A | Discharge status: Home / Self Care | Payer: Self-pay | Source: Home / Self Care | Attending: Gastroenterology

## 2019-02-01 ENCOUNTER — Ambulatory Visit
Admission: RE | Admit: 2019-02-01 | Discharge: 2019-02-01 | Disposition: A | Discharge status: Home / Self Care | Payer: BLUE CROSS/BLUE SHIELD | Attending: Gastroenterology | Admitting: Gastroenterology

## 2019-02-01 DIAGNOSIS — E7849 Other hyperlipidemia: Secondary | ICD-10-CM | POA: Diagnosis not present

## 2019-02-01 DIAGNOSIS — K224 Dyskinesia of esophagus: Secondary | ICD-10-CM | POA: Insufficient documentation

## 2019-02-01 DIAGNOSIS — K219 Gastro-esophageal reflux disease without esophagitis: Secondary | ICD-10-CM | POA: Insufficient documentation

## 2019-02-01 HISTORY — PX: PH IMPEDANCE STUDY: SHX5565

## 2019-02-01 HISTORY — PX: ESOPHAGEAL MANOMETRY: SHX5429

## 2019-02-01 SURGERY — MANOMETRY, ESOPHAGUS

## 2019-02-01 MED ORDER — BUTAMBEN-TETRACAINE-BENZOCAINE 2-2-14 % EX AERO
INHALATION_SPRAY | CUTANEOUS | Status: AC
  Administered 2019-02-01: 2
  Filled 2019-02-01: qty 5

## 2019-02-01 MED ORDER — LIDOCAINE HCL URETHRAL/MUCOSAL 2 % EX GEL
CUTANEOUS | Status: AC
  Administered 2019-02-01: 1
  Filled 2019-02-01: qty 5

## 2019-02-01 SURGICAL SUPPLY — 2 items
FACESHIELD LNG OPTICON STERILE (SAFETY) IMPLANT
GLOVE BIO SURGEON STRL SZ8 (GLOVE) ×4 IMPLANT

## 2019-02-02 ENCOUNTER — Encounter: Payer: Self-pay | Admitting: Gastroenterology

## 2019-02-05 ENCOUNTER — Telehealth: Payer: Self-pay

## 2019-02-05 DIAGNOSIS — E785 Hyperlipidemia, unspecified: Secondary | ICD-10-CM | POA: Diagnosis not present

## 2019-02-05 DIAGNOSIS — K760 Fatty (change of) liver, not elsewhere classified: Secondary | ICD-10-CM | POA: Diagnosis not present

## 2019-02-05 DIAGNOSIS — E8881 Metabolic syndrome: Secondary | ICD-10-CM | POA: Diagnosis not present

## 2019-02-05 DIAGNOSIS — G43119 Migraine with aura, intractable, without status migrainosus: Secondary | ICD-10-CM | POA: Diagnosis not present

## 2019-02-05 NOTE — Telephone Encounter (Signed)
-----   Message from Virgel Manifold, MD sent at 01/26/2019 12:07 PM EST ----- Your Ultrasound showed fatty liver. This is benign fat deposition in the liver that can reverse with weight loss with diet and exercise. It can worsen with weight gain, daily or frequent alcohol use. We should get manometry and pH test like we discussed to evaluate for esophageal spasm or GERD as the cause of your symptoms.   Renee Park, please schedule.

## 2019-02-06 ENCOUNTER — Encounter: Payer: Self-pay | Admitting: Internal Medicine

## 2019-02-13 ENCOUNTER — Encounter: Payer: Self-pay | Admitting: Gastroenterology

## 2019-02-13 ENCOUNTER — Ambulatory Visit: Payer: BLUE CROSS/BLUE SHIELD | Admitting: Gastroenterology

## 2019-02-13 ENCOUNTER — Other Ambulatory Visit: Payer: Self-pay

## 2019-02-13 VITALS — BP 134/82 | HR 81 | Ht <= 58 in | Wt 113.6 lb

## 2019-02-13 DIAGNOSIS — K760 Fatty (change of) liver, not elsewhere classified: Secondary | ICD-10-CM

## 2019-02-13 DIAGNOSIS — R1013 Epigastric pain: Secondary | ICD-10-CM

## 2019-02-13 DIAGNOSIS — G8929 Other chronic pain: Secondary | ICD-10-CM | POA: Diagnosis not present

## 2019-02-13 NOTE — Progress Notes (Signed)
[name], MD [address]  [place]  [place], [place] [address]  Main: [phone]  Fax: [phone]   Primary Care Physician: Cletis Athens, MD   Chief Complaint  Patient presents with  . Follow-up    stomach discomfort and manometry test results (not yet read)    HPI: Renee Park is a 53 y.o. female here for follow-up of epigastric pain, and fatty liver. Patient underwent manometry and pH testing 2 weeks ago.  The results are not available yet.  Patient taking omeprazole twice daily in addition to Mylanta as needed at lunch, and continues to have epigastric abdominal pain.  Does not have any trigger foods that cause the symptoms, occurs would any kind of meals.  No dysphagia.  No weight loss.  Patient is above-stated symptoms have been ongoing for years and patient has been on multiple PPIs in the past.  Patient also states that when she has her epigastric pain, she has a sour taste in her mouth.  Patient underwent EGD on January 03, 2019 which was normal.  Gastric biopsies were negative for H. pylori but showed focal intestinal metaplasia.    Given her fatty liver I asked the patient if she was a higher weight in the past.  She states she has lost 10 pounds over the years, intentionally.  Denies any daily or heavy alcohol use in the past.  States drinks occasionally.  Previous procedures: Colonoscopy October 2015 with Dr. Suzan Garibaldi diarrhea. [place] reported, otherwise normal. Good prep. Extent of exam cecum.Random colon biopsies were unremarkable as well.  Repeat colonoscopy was recommended in 10 years  EGD October 2015 normal with duodenal biopsies taken for evaluation of celiac disease. Biopsies reported unremarkable small intestinal mucosa, no evidence of celiac sprue.  Current Outpatient Medications  Medication Sig Dispense Refill  . ALPRAZolam (XANAX) 0.25 MG tablet TAKE 1 TABLET BY MOUTH TWICE DAILY AS NEEDED. 40 tablet 2  . aspirin  EC 81 MG tablet Take 81 mg by mouth daily.    . butalbital-acetaminophen-caffeine (FIORICET, ESGIC) 50-325-40 MG per tablet Take 2 tablets by mouth every 6 (six) hours as needed for headache.    . dicyclomine (BENTYL) 10 MG capsule Take 10 mg by mouth daily.    . Evolocumab (REPATHA [place]) Inject 140 mg into the skin every 21 ( twenty-one) days. Every 2 weeks    . omeprazole (PRILOSEC) 40 MG capsule     . rosuvastatin (CRESTOR) 10 MG tablet TK 1 T PO QD  4  . sucralfate (CARAFATE) 1 g tablet Take 1 g by mouth 3 (three) times daily.    Marland Kitchen topiramate (TOPAMAX) 50 MG tablet TAKE 1 TABLET BY MOUTH TWICE DAILY 180 tablet 1   No current facility-administered medications for this visit.     Allergies as of 02/13/2019 - Review Complete 02/13/2019  Allergen Reaction Noted  . Lipitor [atorvastatin]  05/24/2016  . Vytorin [ezetimibe-simvastatin]  05/24/2016    ROS:  General: Negative for anorexia, weight loss, fever, chills, fatigue, weakness. ENT: Negative for hoarseness, difficulty swallowing , nasal congestion. CV: Negative for chest pain, angina, palpitations, dyspnea on exertion, peripheral edema.  Respiratory: Negative for dyspnea at rest, dyspnea on exertion, cough, sputum, wheezing.  GI: See history of present illness. GU:  Negative for dysuria, hematuria, urinary incontinence, urinary frequency, nocturnal urination.  Endo: Negative for unusual weight change.    Physical Examination:   BP 134/82   Pulse 81   Ht 4' 8" (1.422 m)  Wt 113 lb 9.6 oz (51.5 kg)   LMP 02/01/2014   BMI 25.47 kg/m   General: Well-nourished, well-developed in no acute distress.  Eyes: No icterus. Conjunctivae pink. Mouth: Oropharyngeal mucosa moist and pink , no lesions erythema or exudate. Neck: Supple, Trachea midline Abdomen: Bowel sounds are normal, nontender, nondistended, no hepatosplenomegaly or masses, no abdominal bruits or hernia , no rebound or guarding.   Extremities: No lower extremity edema.  No clubbing or deformities. Neuro: Alert and oriented x 3.  Grossly intact. Skin: Warm and dry, no jaundice.   Psych: Alert and cooperative, normal mood and affect.   Labs: CMP     Component Value Date/Time   NA 144 01/16/2019 1420   NA 142 04/30/2014 1054   K 4.0 01/16/2019 1420   K 3.6 04/30/2014 1054   CL 105 01/16/2019 1420   CL 113 (H) 04/30/2014 1054   CO2 23 01/16/2019 1420   CO2 22 04/30/2014 1054   GLUCOSE 115 (H) 01/16/2019 1420   GLUCOSE 88 04/30/2014 1054   BUN 16 01/16/2019 1420   BUN 12 04/30/2014 1054   CREATININE 0.75 01/16/2019 1420   CREATININE 0.73 04/30/2014 1054   CALCIUM 9.8 01/16/2019 1420   CALCIUM 9.0 04/30/2014 1054   PROT 7.2 01/16/2019 1420   ALBUMIN 4.8 01/16/2019 1420   AST 27 01/16/2019 1420   ALT 38 (H) 01/16/2019 1420   ALKPHOS 129 (H) 01/16/2019 1420   BILITOT 0.2 01/16/2019 1420   GFRNONAA 91 01/16/2019 1420   GFRNONAA >60 04/30/2014 1054   GFRAA 105 01/16/2019 1420   GFRAA >60 04/30/2014 1054   Lab Results  Component Value Date   WBC 6.5 01/16/2019   HGB 13.7 01/16/2019   HCT 40.3 01/16/2019   MCV 89 01/16/2019   PLT 343 01/16/2019    Imaging Studies: US Abdomen Limited Ruq  Result Date: 01/24/2019 CLINICAL DATA:  Elevated LFTs, history hyperlipidemia, former smoker EXAM: ULTRASOUND ABDOMEN LIMITED RIGHT UPPER QUADRANT COMPARISON:  CT abdomen and pelvis 11/08/2014 FINDINGS: Gallbladder: Normally distended without stones or wall thickening. No pericholecystic fluid or sonographic Murphy sign. Common bile duct: Diameter: 4 mm diameter, normal Liver: Minimally increased hepatic parenchymal echogenicity question fatty infiltration though this can be seen with cirrhosis and certain infiltrative disorders. Hepatic margins appear smooth without evidence of hepatic mass or nodularity. Portal vein is patent on color Doppler imaging with normal direction of blood flow towards the liver. No RIGHT upper quadrant free fluid. IMPRESSION: Probable  mild fatty infiltration of liver as above. Otherwise negative exam. Electronically Signed   By: Lavonia Dana M.D.   On: 01/24/2019 15:20    Assessment and Plan:   Renee Park is a 53 y.o. y/o female here for follow-up of epigastric abdominal pain, chronic, with history of multiple PPI use in the past  Epigastric pain associated with bad taste in mouth, possibly related to reflux However, despite omeprazole twice daily she continues to have symptoms We will await her manometry and pH monitoring results.  Dr. Vicente Males states he will let me know once these are read. I will call the patient once results are available  If it does not show acid reflux, and there is no use and going up on her PPI.  If it is consistent with acid reflux, can increase her PPI dose or add H2 RA at bedtime  The fatty liver reported on her ultrasound was done reported to be minimal.  Her transaminases are not elevated but her alk phos  is mildly elevated. We will repeat alk phos Rest of liver work-up including viral and autoimmune labs were negative I have recommended hepatitis a and B vaccination patient was asked to get this done at her primary care office or come to our [place] office for nurse visit for vaccination as we do not have it here today.  Finding of fatty liver on imaging discussed with patient Diet, weight loss, and exercise encouraged along with avoiding hepatotoxic drugs including alcohol Risk of progression to cirrhosis if above measures are not instituted were discussed as well, and patient verbalized understanding    Dr [name]

## 2019-02-14 LAB — HEPATIC FUNCTION PANEL
ALT: 51 IU/L — ABNORMAL HIGH (ref 0–32)
AST: 35 IU/L (ref 0–40)
Albumin: 5 g/dL — ABNORMAL HIGH (ref 3.8–4.9)
Alkaline Phosphatase: 133 IU/L — ABNORMAL HIGH (ref 39–117)
Bilirubin Total: <0.2 mg/dL (ref 0.0–1.2)
Bilirubin, Direct: 0.07 mg/dL (ref 0.00–0.40)
Total Protein: 6.9 g/dL (ref 6.0–8.5)

## 2019-02-14 LAB — GAMMA GT: GGT: 325 IU/L — AB (ref 0–60)

## 2019-02-14 LAB — ALPHA-1-ANTITRYPSIN: A-1 Antitrypsin: 124 mg/dL (ref 101–187)

## 2019-02-19 ENCOUNTER — Telehealth: Payer: Self-pay | Admitting: Gastroenterology

## 2019-02-19 NOTE — Telephone Encounter (Signed)
Pt is calling for results to see if they are back please call pt cell #

## 2019-02-19 NOTE — Telephone Encounter (Signed)
Renee Park , is this manometry ready to be read?  Bailey Mech

## 2019-02-20 ENCOUNTER — Telehealth: Payer: Self-pay | Admitting: Gastroenterology

## 2019-02-20 NOTE — Telephone Encounter (Signed)
Patient called wanting to know what Hepatitis shot to take at [facility]. Per Dr. Bonna Gains, she only needs Hep A.

## 2019-02-22 NOTE — Telephone Encounter (Signed)
Patient called & l/m on v/m wanting results.

## 2019-02-22 NOTE — Telephone Encounter (Signed)
Pt notified that we are waiting on manometry report to be read and that she does need HEP A/B vaccine per note of 02/13/2019.

## 2019-02-22 NOTE — Telephone Encounter (Signed)
Renee Park - manometry machine not working, will be a while before it is repaired and read. I will let Dr Bonna Gains know once its been done

## 2019-02-23 ENCOUNTER — Ambulatory Visit: Payer: BLUE CROSS/BLUE SHIELD

## 2019-02-27 ENCOUNTER — Telehealth: Payer: Self-pay | Admitting: Gastroenterology

## 2019-02-27 NOTE — Telephone Encounter (Signed)
Patient called & would like her results from the manometry test & PH study.

## 2019-03-01 NOTE — Telephone Encounter (Signed)
I called pt and informed her about the manometry machine remains broken and that we are unable to retrieve any reports, that the screen is blank. I tried to reassure her that as soon as we have those results we would call her and she can feel free to contact us also. Patient said "she was over it and she would not be calling back". Pt said this in a calm manner and thanked me. I gave her our sincere apology.

## 2019-03-01 NOTE — Telephone Encounter (Signed)
Dr. Bonna Gains, I informed pt that results not ready but she stated she called endocrinology last week and said they were read. Pt is being very patient but is getting frustrated. Please let me know when I may inform her of what the results are.  Thank you so much.

## 2019-03-13 ENCOUNTER — Telehealth: Payer: Self-pay | Admitting: Gastroenterology

## 2019-03-13 NOTE — Telephone Encounter (Signed)
Please ask her if the dexilant helps her better. If yes we can prescribe that instead of Omeprazole.

## 2019-03-13 NOTE — Telephone Encounter (Signed)
I talked with Gwenyth Ober is off today, will be back tomorrow. But Wannetta Sender said the machine remains broken and they are continuing to work on this. Patient called Wannetta Sender this morning and told the patient this.

## 2019-03-13 NOTE — Telephone Encounter (Signed)
Pt informed that machine remains broken and they are working on this. She states she has an appt with the dieticain at [facility] soon. She is having so much discomfort/indigestion she wandered if there was anything else she could do. Sometimes she takes Dexilant 2-3 times a day.

## 2019-03-13 NOTE — Telephone Encounter (Signed)
Patient called checking on her results from the Manometry test.She was aware the machine was down, but it has been 6 wks. Please call her with the status.

## 2019-03-14 ENCOUNTER — Telehealth: Payer: Self-pay

## 2019-03-14 NOTE — Telephone Encounter (Signed)
Patient states that she has never taken Conv[place].  She has only taken Aciphex and Nexium.  She said she feels you are not looking at her chart.  She is currently at work and will call Tati back to schedule her telemed visit with you for tomorrow.  Thanks [employer]

## 2019-03-14 NOTE — Telephone Encounter (Signed)
Thanked patient for her clarifying and informed her that you were looking at her chart, and the nurse telephone note from yesterday mentioned Dexilant and that is why you asked. Informed her that hercurrent medication list in our system only states Omeprazole. Tried to  reassure her that you will discuss this further with her when you talk, and change around her medications as needed.   Thanks [employer]

## 2019-03-14 NOTE — Telephone Encounter (Signed)
LVM for pt to call office back to ask her if the dexilant helps her better. If yes we can prescribe that instead of Omeprazole.    Thanks [employer]

## 2019-03-15 ENCOUNTER — Telehealth: Payer: BLUE CROSS/BLUE SHIELD | Admitting: Gastroenterology

## 2019-03-15 ENCOUNTER — Other Ambulatory Visit: Payer: Self-pay

## 2019-03-15 ENCOUNTER — Encounter: Payer: BLUE CROSS/BLUE SHIELD | Attending: Internal Medicine | Admitting: Dietician

## 2019-03-15 ENCOUNTER — Ambulatory Visit (INDEPENDENT_AMBULATORY_CARE_PROVIDER_SITE_OTHER): Payer: BLUE CROSS/BLUE SHIELD | Admitting: Gastroenterology

## 2019-03-15 ENCOUNTER — Encounter: Payer: Self-pay | Admitting: Dietician

## 2019-03-15 VITALS — Ht <= 58 in | Wt 114.0 lb

## 2019-03-15 DIAGNOSIS — K760 Fatty (change of) liver, not elsewhere classified: Secondary | ICD-10-CM | POA: Diagnosis not present

## 2019-03-15 DIAGNOSIS — Z713 Dietary counseling and surveillance: Secondary | ICD-10-CM | POA: Diagnosis not present

## 2019-03-15 DIAGNOSIS — E785 Hyperlipidemia, unspecified: Secondary | ICD-10-CM

## 2019-03-15 DIAGNOSIS — K219 Gastro-esophageal reflux disease without esophagitis: Secondary | ICD-10-CM | POA: Diagnosis not present

## 2019-03-15 MED ORDER — BACLOFEN 10 MG PO TABS: 10.0000 mg | ORAL_TABLET | Freq: Every day | ORAL | 0 refills | Status: DC

## 2019-03-15 MED ORDER — BACLOFEN 5 MG PO TABS: 5.0000 mg | ORAL_TABLET | Freq: Two times a day (BID) | ORAL | 0 refills | Status: DC

## 2019-03-15 NOTE — Progress Notes (Signed)
Medical Nutrition Therapy: Visit start time: 1630  end time: 1730  Assessment:  Diagnosis: fatty liver, hyperlipidemia Past medical history: GERD, migraines Psychosocial issues/ stress concerns: anxiety, takes anti-anxiety medication  Preferred learning method:  . Auditory . Visual   Current weight: 114.0lbs with shoes Height: 4'8" Medications, supplements: reconciled list in medical record. Takes multivitamin daily.   Progress and evaluation:   Patient reports long history of hyperlipidemia, and intolerance of some cholesterol meds; now taking injection in hopes of reducing need for oral meds.   She has had stable weight between 110-114lbs but wants to lose 5-15lbs to reduce fatty liver.   She reports frequent heartburn/ indigestion/ GERD symptoms, along with abdominal pain. She has had frequent constipation since childhood.   She has made diet changes to work on these health issues, would like help with ensuring nutritional balance and adequacy, and some weight loss, while improving cholesterol and fatty liver.    Physical activity: walking and core strengthening 30-45 minutes, 1-2x a week as tolerated with abdominal pain.  Dietary Intake:  Usual eating pattern includes 3 meals and 1-2 snacks per day. Dining out frequency: 0-1 meals per week.  Breakfast: 5:30am-- unable to eat large meal; eggs with cheese, or PBJ sandwich with small portion jelly only + milk 2%; or cottage cheese with tomato. Coffee 4:30am before breakfast Snack: none, drinks water Lunch: healthy frozen meal or leftovers + fruit ie mango, banana, +/or celery, carrots; salad with chicken or tuna often without dressing Snack: (sometimes) 100-cal pkg unsalted nuts with a few grapes, cheese; yogurt to settle stomach if needed Supper: usually small portion meat but sometimes no meat ie tomatoes and rice; baked chicken; corned beef with carrots and small potato. Occasionally shredded wheat cereal if unable to eat other  foods. Sometimes prunes if needed for laxative properties.  Snack: usually none; occasionally nuts in small portion (heartburn) or rarely other sweet Beverages: water, coffee in am, occasional diet ginger ale to settle stomach  Nutrition Care Education: Topics covered: weight control for fatty liver, hyperlipidemia, GERD Basic nutrition: basic food groups, appropriate nutrient balance, appropriate meal and snack schedule, general nutrition guidelines    Weight control: benefits of weight control, determining reasonable weight goal, importance of low fat and low sugar food choices; calculated energy needs at about 1000kcal for gradual weight loss and provided guidance for 45% CHO, 25% protein, and 30% fat; discussed appropriate food portions.  Hyperlipidemia: limiting unhealthy fats; healthy fats in small amounts, role of fiber, importance of veg and fruits GERD:  Eating small meals/ snacks at regular intervals; recommended foods and foods to limit/ avoid; other factors that could trigger symptoms.  Nutritional Diagnosis:  [place]-2.2 Altered nutrition-related laboratory As related to fatty liver.  As evidenced by patient with elevated liver enzymes with MD diagnosis of fatty liver.  Intervention:   Instruction and discussion as noted above.  Patient is generally making healthy food choices.   Established goals to improve overall nutrient balance and control caloric intake for weight loss.   No follow-up scheduled at this time, patient will schedule later if needed.  Education Materials given:  . Plate Planner with food lists . Sample meal pattern . Nutrition Therapy for Gastroesophageal Reflux disease (AND) . Goals/ instructions   Learner/ who was taught:  . Patient    Level of understanding: Marland Kitchen Verbalizes/ demonstrates competency   Demonstrated degree of understanding via:   Teach back Learning barriers: . None  Willingness to learn/ readiness for change: .  Eager, change in  progress   Monitoring and Evaluation:  Dietary intake, exercise, blood lipids, GI symptoms, and body weight      follow up: prn

## 2019-03-15 NOTE — Patient Instructions (Signed)
   Include small portions of carbohydrate foods and increase portions of low-carb veggies while including protein sources with meals.   Use frozen Lean Cuisine/ Healthy Choice meals as guide to portion leftovers for meals (same size dish).   Can add a low-carb veggie or fruit to most low-cal frozen meals.   Consider keeping a journal of food intake and GI symptoms to help determine cause of symptoms.   Keep up regular exercise as tolerated.

## 2019-03-15 NOTE — Progress Notes (Signed)
Renee Antigua, MD [address]  [place]  [place], [place] [address]  Main: [phone]  Fax: [phone]   Primary Care Physician: Cletis Athens, MD  Virtual Visit via Telephone Note  I connected with patient on 03/15/19 at  9:30 AM EDT by telephone and verified that I am speaking with the correct person using two identifiers.   I discussed the limitations, risks, security and privacy concerns of performing an evaluation and management service by telephone and the availability of in person appointments. I also discussed with the patient that there may be a patient responsible charge related to this service. The patient expressed understanding and agreed to proceed.  Location of Patient: at Work (in a private room) on her cell phone Location of Provider: Home Persons involved: Patient and provider only   History of Present Illness: CC: Epigastric pain  HPI: Renee Park is a 53 y.o. female with history of epigastric pain and fatty liver.  Patient reports continued bilateral upper quadrant and midepigastric pain and indigestion.  She takes omeprazole 30 minutes before breakfast daily, and is also taking Mylanta or Gaviscon as needed throughout the day.  She states taking these medications helps her belch or burp, but does not take away her pain.  No dysphagia.  No weight loss.  She is seeing a nutritionist today or tomorrow to work on weight loss and dietary changes that can help with abdominal pain.  Patient underwent EGD on January 03, 2019 which was normal. Gastric biopsies were negative for H. pylori but showed focal intestinal metaplasia.  She subsequently underwent pH monitoring, and the results of which are now delayed due to error with the manometry equipment.  The endoscopic unit staff, Carlean Purl has been trying to get this fixed for a long time.  Dr. Vicente Males performed the procedure and will be reading the results when they are available to read.  Given her  fatty liver I asked the patient if she was a higher weight in the past.  She states she has lost 10 pounds over the years, intentionally.  Denies any daily or heavy alcohol use in the past.  States drinks occasionally.  Previous procedures: Colonoscopy October 2015 with Dr. Suzan Garibaldi diarrhea. [place] reported, otherwise normal. Good prep. Extent of exam cecum.Random colon biopsies were unremarkable as well.  Repeat colonoscopy was recommended in 10 years  EGD October 2015 normal with duodenal biopsies taken for evaluation of celiac disease. Biopsies reported unremarkable small intestinal mucosa, no evidence of celiac sprue.   Current Outpatient Medications  Medication Sig Dispense Refill  . ALPRAZolam (XANAX) 0.25 MG tablet TAKE 1 TABLET BY MOUTH TWICE DAILY AS NEEDED. 40 tablet 2  . aspirin EC 81 MG tablet Take 81 mg by mouth daily.    . butalbital-acetaminophen-caffeine (FIORICET, ESGIC) 50-325-40 MG per tablet Take 2 tablets by mouth every 6 (six) hours as needed for headache.    . dicyclomine (BENTYL) 10 MG capsule Take 10 mg by mouth daily.    . Evolocumab (REPATHA [place]ou) Inject 140 mg into the skin every 21 ( twenty-one) days. Every 2 weeks    . omeprazole (PRILOSEC) 40 MG capsule     . rosuvastatin (CRESTOR) 10 MG tablet TK 1 T PO QD  4  . sucralfate (CARAFATE) 1 g tablet Take 1 g by mouth 3 (three) times daily.    Marland Kitchen topiramate (TOPAMAX) 50 MG tablet TAKE 1 TABLET BY MOUTH TWICE DAILY 180 tablet 1   No current  facility-administered medications for this visit.     Allergies as of 03/15/2019 - Review Complete 02/13/2019  Allergen Reaction Noted  . Lipitor [atorvastatin]  05/24/2016  . Vytorin [ezetimibe-simvastatin]  05/24/2016    Review of Systems:    All systems reviewed and negative except where noted in HPI.   Observations/Objective:  Labs: CMP     Component Value Date/Time   NA 144 01/16/2019 1420   NA 142 04/30/2014 1054   K 4.0 01/16/2019 1420   K 3.6  04/30/2014 1054   CL 105 01/16/2019 1420   CL 113 (H) 04/30/2014 1054   CO2 23 01/16/2019 1420   CO2 22 04/30/2014 1054   GLUCOSE 115 (H) 01/16/2019 1420   GLUCOSE 88 04/30/2014 1054   BUN 16 01/16/2019 1420   BUN 12 04/30/2014 1054   CREATININE 0.75 01/16/2019 1420   CREATININE 0.73 04/30/2014 1054   CALCIUM 9.8 01/16/2019 1420   CALCIUM 9.0 04/30/2014 1054   PROT 6.9 02/13/2019 1042   ALBUMIN 5.0 (H) 02/13/2019 1042   AST 35 02/13/2019 1042   ALT 51 (H) 02/13/2019 1042   ALKPHOS 133 (H) 02/13/2019 1042   BILITOT <0.2 02/13/2019 1042   GFRNONAA 91 01/16/2019 1420   GFRNONAA >60 04/30/2014 1054   GFRAA 105 01/16/2019 1420   GFRAA >60 04/30/2014 1054   Lab Results  Component Value Date   WBC 6.5 01/16/2019   HGB 13.7 01/16/2019   HCT 40.3 01/16/2019   MCV 89 01/16/2019   PLT 343 01/16/2019    Imaging Studies: No results found.  Assessment and Plan:   Renee Park is a 53 y.o. y/o female with epigastric abdominal pain despite PPI use  Assessment and Plan: Patient symptoms have been unrelieved with once daily PPI, and use of antacids daily as needed. pH monitoring was done to evaluate for nonacid reflux versus acid reflux The results from this procedure have been significantly delayed as noted above in HPI.  Patient is frustrated with ongoing symptoms.  Therefore, it is reasonable at this time to try non-acid reflux treatment such as baclofen starting at a low dose.  If this helps, then it would be an indication that her symptoms may be related to nonacid reflux instead of acid reflux and we can start decreasing her omeprazole.  Patient is willing to try baclofen after discussing risks and benefits of the medication.  Adverse effects discussed in detail as well and patient was asked to call us if she experiences any of these adverse effects and stop the medication immediately.  She verbalized understanding.  She was asked to call us in 2 weeks if no symptom relief to  see if we need to increase the medication dosage or discontinue the medication at that time.  Follow Up Instructions: Clinic follow-up in 3 to 4 months   I discussed the assessment and treatment plan with the patient. The patient was provided an opportunity to ask questions and all were answered. The patient agreed with the plan and demonstrated an understanding of the instructions.   The patient was advised to call back or seek an in-person evaluation if the symptoms worsen or if the condition fails to improve as anticipated.  I provided 11 minutes of non-face-to-face time during this encounter.   Virgel Manifold, MD  Speech recognition software was used to dictate this note.

## 2019-03-21 NOTE — Telephone Encounter (Signed)
Had televisit with Dr. Bonna Gains on 03/15/2019.

## 2019-03-28 ENCOUNTER — Telehealth: Payer: Self-pay | Admitting: Gastroenterology

## 2019-03-28 NOTE — Telephone Encounter (Signed)
Patient called about her   baclofen (LIORESAL) 10 MG tablet   She currently takes 1 pill in the evening and this has helped still leaving some pain. She would like to know if this could be increased to two pills a day? If so please call patient & let her know the answer. She uses [facility] in [place].

## 2019-03-29 ENCOUNTER — Other Ambulatory Visit: Payer: Self-pay

## 2019-03-29 MED ORDER — BACLOFEN 10 MG PO TABS: 10.0000 mg | ORAL_TABLET | Freq: Two times a day (BID) | ORAL | 2 refills | Status: AC

## 2019-03-29 NOTE — Telephone Encounter (Signed)
Pt aware and wants to thank Dr. Bonna Gains. She had tried this 2xd and it has helped.

## 2019-05-15 ENCOUNTER — Ambulatory Visit: Payer: BLUE CROSS/BLUE SHIELD | Admitting: Gastroenterology

## 2019-05-17 DIAGNOSIS — J00 Acute nasopharyngitis [common cold]: Secondary | ICD-10-CM | POA: Diagnosis not present

## 2019-05-17 DIAGNOSIS — E669 Obesity, unspecified: Secondary | ICD-10-CM | POA: Diagnosis not present

## 2019-05-17 DIAGNOSIS — K2990 Gastroduodenitis, unspecified, without bleeding: Secondary | ICD-10-CM | POA: Diagnosis not present

## 2019-05-17 DIAGNOSIS — J399 Disease of upper respiratory tract, unspecified: Secondary | ICD-10-CM | POA: Diagnosis not present

## 2019-05-22 ENCOUNTER — Encounter: Payer: Self-pay | Admitting: Gastroenterology

## 2019-05-22 ENCOUNTER — Telehealth: Payer: Self-pay | Admitting: Gastroenterology

## 2019-05-22 ENCOUNTER — Ambulatory Visit (INDEPENDENT_AMBULATORY_CARE_PROVIDER_SITE_OTHER): Payer: BLUE CROSS/BLUE SHIELD | Admitting: Gastroenterology

## 2019-05-22 ENCOUNTER — Other Ambulatory Visit: Payer: Self-pay

## 2019-05-22 DIAGNOSIS — R350 Frequency of micturition: Secondary | ICD-10-CM | POA: Diagnosis not present

## 2019-05-22 DIAGNOSIS — R1084 Generalized abdominal pain: Secondary | ICD-10-CM | POA: Diagnosis not present

## 2019-05-22 NOTE — Telephone Encounter (Signed)
Called patient to schedule a 3 mth f/u. She didn't want to schedule until after Debbie scheduled her procedure.

## 2019-05-22 NOTE — Progress Notes (Signed)
Patient ID: Renee Park, female   DOB: September 21, 1966, 53 y.o.   MRN: [id number] Pt notified of CT scheduled for 05/25/19 at [facility] at 11:30. To arrived [facility] the medical mall at 11:15 to register. NPO 4hrs prior to CT and to pick up prep before Friday. Also pt needs a note for work today due to unable to get up/down without significant pain and Friday due to her CT scan. Note Renee be sent via my chart.

## 2019-05-22 NOTE — Progress Notes (Signed)
[name], MD [address]  [place]  [place], [place] [address]  Main: [phone]  Fax: [phone]   Primary Care Physician: Cletis Athens, MD  Virtual Visit via Video Note  I connected with patient on 05/22/19 at 11:15 AM EDT by video (using [email or web address]) and verified that I am speaking with the correct person using two identifiers.   I discussed the limitations, risks, security and privacy concerns of performing an evaluation and management service by video and the availability of in person appointments. I also discussed with the patient that there may be a patient responsible charge related to this service. The patient expressed understanding and agreed to proceed.  Location of Patient: Home Location of Provider: Home Persons involved: Patient and provider only (Nursing staff checked in patient via phone but were not physically involved in the video interaction - see their notes)   History of Present Illness: Chief Complaint  Patient presents with  . Abdominal Pain    pain on left lower side/abdomen, tender to touch. more pain noted when getting up/down. nausea.    HPI: Renee Park is a 53 y.o. female reports left lower quadrant to left suprapubic abdominal pain that started yesterday.  Dull, nonradiating, exacerbating with movements or bending.  Patient took Advil for this and it helped but was at its worst this morning at 3 AM.  No fever or chills.  No nausea or vomiting.  Reports abdomen is soft.  Denies any altered bowel habits.  Had 2 formed bowel movements today without loose stools or blood.  Denies any upper abdominal pain.  Patient was started on baclofen on previous visit due to ongoing symptoms despite PPI and H2 RA therapy.  This was started for possible nonacid reflux as cause of her pain since manometry with pH study results were not available.  Patient states this has significantly helped her symptoms and she does not have the upper abdominal  pain anymore.  Previous history:  Patient's manometry with pH results from her previous study are still pending due to equipment at her.  Patient underwent EGD on January 15, 2020whichwas normal. Gastric biopsies were negative for H. pylori but showed focal intestinal metaplasia. She subsequently underwent pH monitoring, and the results of which are now delayed due to error with the manometry equipment.  The endoscopic unit staff, Carlean Purl has been trying to get this fixed for a long time.  Dr. Vicente Males performed the procedure and will be reading the results when they are available to read.  Given her fatty liver I asked the patient if she was a higher weight in the past. She states she has lost 10 pounds over the years, intentionally. Denies any daily or heavy alcohol use in the past. States drinks occasionally.  Previous procedures: Colonoscopy October 2015 with Dr. Suzan Garibaldi diarrhea. Salmon reported, otherwise normal. Good prep. Extent of exam cecum.Random colon biopsies were unremarkable as well.  Repeat colonoscopy was recommended in 10 years  EGD October 2015 normal with duodenal biopsies taken for evaluation of celiac disease. Biopsies reported unremarkable small intestinal mucosa, no evidence of celiac sprue.   Current Outpatient Medications  Medication Sig Dispense Refill  . ALPRAZolam (XANAX) 0.25 MG tablet TAKE 1 TABLET BY MOUTH TWICE DAILY AS NEEDED. 40 tablet 2  . aspirin EC 81 MG tablet Take 81 mg by mouth daily.    . baclofen (LIORESAL) 10 MG tablet Take 1 tablet (10 mg total) by mouth 2 (two) times daily. [place]  each 2  . chlorhexidine (PERIDEX) 0.12 % solution TAKE 1 TEA PO AND RINSE PRN    . dicyclomine (BENTYL) 10 MG capsule Take 10 mg by mouth daily.    . fluticasone (FLONASE) 50 MCG/ACT nasal spray     . Multiple Vitamins-Minerals (MULTIVITAMIN ADULTS PO) Take by mouth.    Marland Kitchen REPATHA 140 MG/ML SOSY INJ 1 ML [place] Q 2 WKS    . rosuvastatin (CRESTOR) 10 MG  tablet TK 1 T PO QD  4  . topiramate (TOPAMAX) 50 MG tablet TAKE 1 TABLET BY MOUTH TWICE DAILY 180 tablet 1  . Alum Hydroxide-Mag Carbonate (GAVISCON PO) Take by mouth.    . metoprolol tartrate (LOPRESSOR) 50 MG tablet Take 50 mg by mouth daily.    Marland Kitchen omeprazole (PRILOSEC) 40 MG capsule     . sucralfate (CARAFATE) 1 g tablet Take 1 g by mouth 3 (three) times daily.     No current facility-administered medications for this visit.     Allergies as of 05/22/2019 - Review Complete 05/22/2019  Allergen Reaction Noted  . Lipitor [atorvastatin]  05/24/2016  . Vytorin [ezetimibe-simvastatin]  05/24/2016    Review of Systems:    All systems reviewed and negative except where noted in HPI.   Observations/Objective:  Labs: CMP     Component Value Date/Time   NA 144 01/16/2019 1420   NA 142 04/30/2014 1054   K 4.0 01/16/2019 1420   K 3.6 04/30/2014 1054   CL 105 01/16/2019 1420   CL 113 (H) 04/30/2014 1054   CO2 23 01/16/2019 1420   CO2 22 04/30/2014 1054   GLUCOSE 115 (H) 01/16/2019 1420   GLUCOSE 88 04/30/2014 1054   BUN 16 01/16/2019 1420   BUN 12 04/30/2014 1054   CREATININE 0.75 01/16/2019 1420   CREATININE 0.73 04/30/2014 1054   CALCIUM 9.8 01/16/2019 1420   CALCIUM 9.0 04/30/2014 1054   PROT 6.9 02/13/2019 1042   ALBUMIN 5.0 (H) 02/13/2019 1042   AST 35 02/13/2019 1042   ALT 51 (H) 02/13/2019 1042   ALKPHOS 133 (H) 02/13/2019 1042   BILITOT <0.2 02/13/2019 1042   GFRNONAA 91 01/16/2019 1420   GFRNONAA >60 04/30/2014 1054   GFRAA 105 01/16/2019 1420   GFRAA >60 04/30/2014 1054   Lab Results  Component Value Date   WBC 6.5 01/16/2019   HGB 13.7 01/16/2019   HCT 40.3 01/16/2019   MCV 89 01/16/2019   PLT 343 01/16/2019    Imaging Studies: No results found.  Assessment and Plan:   Renee Park is a 53 y.o. y/o female with left lower quadrant abdominal pain for 1 day  Assessment and Plan: Patient does not have any fever chills, nausea vomiting, altered  bowel habits or any other alarm symptoms  We will order CT abdomen pelvis for further evaluation of the abdominal pain  we will also order UA to rule out UTI.  Patient denies any dysuria, burning on urination, hematuria.  However she states she had to urinate 7 times this morning.  Pain may be musculoskeletal in nature since it exacerbates with certain movements and on bending over.  I have asked her to use Tylenol as needed and warm and cold packs to the area until we await her CT scan this week.  Follow Up Instructions: We will follow-up with patient after above results.  Clinic follow-up in 3 months or earlier if needed.   I discussed the assessment and treatment plan with the patient. The patient  was provided an opportunity to ask questions and all were answered. The patient agreed with the plan and demonstrated an understanding of the instructions.   The patient was advised to call back or seek an in-person evaluation if the symptoms worsen or if the condition fails to improve as anticipated.  I provided 20 minutes of face-to-face time via video software during this encounter. Additional time was spent in reviewing patient's chart, placing orders etc.   Virgel Manifold, MD  Speech recognition software was used to dictate this note.

## 2019-05-23 LAB — URINALYSIS, ROUTINE W REFLEX MICROSCOPIC
Bilirubin, UA: NEGATIVE
Glucose, UA: NEGATIVE
Ketones, UA: NEGATIVE
Leukocytes,UA: NEGATIVE
Nitrite, UA: NEGATIVE
Protein,UA: NEGATIVE
RBC, UA: NEGATIVE
Specific Gravity, UA: 1.007 (ref 1.005–1.030)
Urobilinogen, Ur: 0.2 mg/dL (ref 0.2–1.0)
pH, UA: 6.5 (ref 5.0–7.5)

## 2019-05-25 ENCOUNTER — Other Ambulatory Visit: Payer: Self-pay

## 2019-05-25 ENCOUNTER — Ambulatory Visit
Admission: RE | Admit: 2019-05-25 | Discharge: 2019-05-25 | Disposition: A | Discharge status: Home / Self Care | Payer: BLUE CROSS/BLUE SHIELD | Source: Ambulatory Visit | Attending: Gastroenterology | Admitting: Gastroenterology

## 2019-05-25 DIAGNOSIS — K5730 Diverticulosis of large intestine without perforation or abscess without bleeding: Secondary | ICD-10-CM | POA: Diagnosis not present

## 2019-05-25 DIAGNOSIS — R1084 Generalized abdominal pain: Secondary | ICD-10-CM | POA: Diagnosis not present

## 2019-05-25 MED ORDER — IOHEXOL 300 MG/ML  SOLN
75.0000 mL | Freq: Once | INTRAMUSCULAR | Status: AC | PRN
  Administered 2019-05-25: 75 mL via INTRAVENOUS

## 2019-05-26 NOTE — Progress Notes (Signed)
Your CT scan does not show any acute abnormalities that would cause pain. No inflammation in your GI tract. Normal stomach and appendix. Few diverticula are seen which are outpouchings of your colon that occur due to constipation or not enough fiber. These are not inflamed, I.e. there is no diverticulitis. I would recommend taking miralax daily for the next week and using ice packs or warm compress to the area and using tylenol as needed as your pain is likely musculoskeletal in origin.   A lung nodule is reported that would need to be followed by your PCP. We will forward them these results. Please follow up with them to see if this needs repeat CT scans in the future.

## 2019-05-26 NOTE — Progress Notes (Signed)
Forwarding results to Dr. Lavera Guise for his review to determine follow up for lung nodule reported.

## 2019-06-13 ENCOUNTER — Ambulatory Visit: Payer: BLUE CROSS/BLUE SHIELD | Admitting: Gastroenterology

## 2019-08-15 DIAGNOSIS — Z7689 Persons encountering health services in other specified circumstances: Secondary | ICD-10-CM | POA: Diagnosis not present

## 2019-08-15 DIAGNOSIS — G43709 Chronic migraine without aura, not intractable, without status migrainosus: Secondary | ICD-10-CM | POA: Diagnosis not present

## 2019-08-15 DIAGNOSIS — F411 Generalized anxiety disorder: Secondary | ICD-10-CM | POA: Diagnosis not present

## 2019-08-15 DIAGNOSIS — E782 Mixed hyperlipidemia: Secondary | ICD-10-CM | POA: Diagnosis not present

## 2019-08-16 ENCOUNTER — Encounter: Payer: Self-pay | Admitting: Gastroenterology

## 2019-08-23 DIAGNOSIS — G43909 Migraine, unspecified, not intractable, without status migrainosus: Secondary | ICD-10-CM | POA: Diagnosis not present

## 2019-08-23 DIAGNOSIS — G43709 Chronic migraine without aura, not intractable, without status migrainosus: Secondary | ICD-10-CM | POA: Diagnosis not present

## 2019-09-07 DIAGNOSIS — E7800 Pure hypercholesterolemia, unspecified: Secondary | ICD-10-CM | POA: Diagnosis not present

## 2019-09-07 DIAGNOSIS — Z79899 Other long term (current) drug therapy: Secondary | ICD-10-CM | POA: Diagnosis not present

## 2019-09-07 DIAGNOSIS — Z87891 Personal history of nicotine dependence: Secondary | ICD-10-CM | POA: Diagnosis not present

## 2019-09-11 DIAGNOSIS — Z1231 Encounter for screening mammogram for malignant neoplasm of breast: Secondary | ICD-10-CM | POA: Diagnosis not present

## 2019-09-12 DIAGNOSIS — R51 Headache: Secondary | ICD-10-CM | POA: Diagnosis not present

## 2019-09-19 DIAGNOSIS — G43709 Chronic migraine without aura, not intractable, without status migrainosus: Secondary | ICD-10-CM | POA: Diagnosis not present

## 2019-09-19 DIAGNOSIS — K219 Gastro-esophageal reflux disease without esophagitis: Secondary | ICD-10-CM | POA: Diagnosis not present

## 2019-09-19 DIAGNOSIS — F411 Generalized anxiety disorder: Secondary | ICD-10-CM | POA: Diagnosis not present

## 2019-09-28 DIAGNOSIS — Z23 Encounter for immunization: Secondary | ICD-10-CM | POA: Diagnosis not present

## 2019-09-28 DIAGNOSIS — Z113 Encounter for screening for infections with a predominantly sexual mode of transmission: Secondary | ICD-10-CM | POA: Diagnosis not present

## 2019-09-28 DIAGNOSIS — Z1159 Encounter for screening for other viral diseases: Secondary | ICD-10-CM | POA: Diagnosis not present

## 2019-09-28 DIAGNOSIS — Z0000 Encounter for general adult medical examination without abnormal findings: Secondary | ICD-10-CM | POA: Diagnosis not present

## 2019-10-24 DIAGNOSIS — R519 Headache, unspecified: Secondary | ICD-10-CM | POA: Diagnosis not present

## 2019-10-31 ENCOUNTER — Telehealth: Payer: Self-pay

## 2019-10-31 NOTE — Telephone Encounter (Signed)
Patient verbalized understanding of results  

## 2019-10-31 NOTE — Telephone Encounter (Signed)
-----   Message from Virgel Manifold, MD sent at 10/31/2019 12:53 PM EST ----- Please let the patient know, the manometry and pH results did not show any significant concerning abnormalities. However, it does suggest that your chest pain episodes may be related to the time where acid reflux is occurring. It also suggested something called EGJ outflow obstruction, but without any difficulty in swallowing food this does not need any further testing or intervention. Take your reflux medication as that may be helping your symptoms.

## 2019-10-31 NOTE — Telephone Encounter (Signed)
Called and left a message for call back  

## 2019-11-24 DIAGNOSIS — Z1159 Encounter for screening for other viral diseases: Secondary | ICD-10-CM | POA: Diagnosis not present

## 2019-11-24 DIAGNOSIS — Z20828 Contact with and (suspected) exposure to other viral communicable diseases: Secondary | ICD-10-CM | POA: Diagnosis not present

## 2020-01-09 ENCOUNTER — Encounter: Payer: Self-pay | Admitting: Gastroenterology

## 2020-01-28 DIAGNOSIS — R519 Headache, unspecified: Secondary | ICD-10-CM | POA: Diagnosis not present

## 2020-01-28 DIAGNOSIS — E7800 Pure hypercholesterolemia, unspecified: Secondary | ICD-10-CM | POA: Diagnosis not present

## 2020-03-12 DIAGNOSIS — E7800 Pure hypercholesterolemia, unspecified: Secondary | ICD-10-CM | POA: Diagnosis not present

## 2020-03-20 IMAGING — CT CT ABDOMEN AND PELVIS WITH CONTRAST
2 of 5 series · 15 of 46 positions shown, 17 images · IV contrast (APPLIED)
Comparison: CT scan dated 11/08/2014

CLINICAL DATA: Left lower quadrant pain. Generalized abdominal
pain.

EXAM:
CT ABDOMEN AND PELVIS WITH CONTRAST
TECHNIQUE: Multidetector CT imaging of the abdomen and pelvis was performed
using the standard protocol following bolus administration of
intravenous contrast.
CONTRAST:  75mL OMNIPAQUE IOHEXOL 300 MG/ML  SOLN

[Series 2: routine abd/pel with · axial · 0.58mm/px · z∈[-939,-549]mm · 12 of 88 slices shown, 14 images]
[im 5/88  soft-tissue]
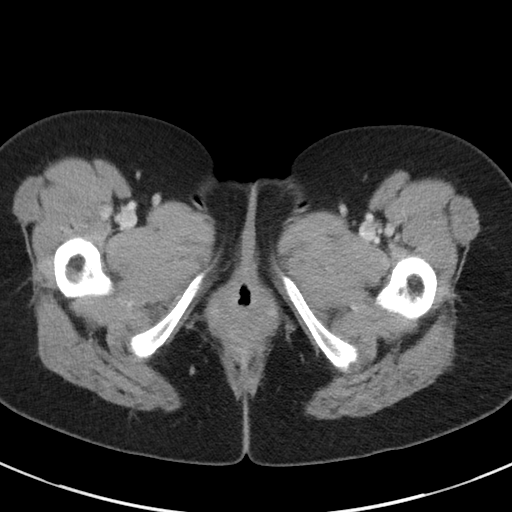
[im 5/88  bone]
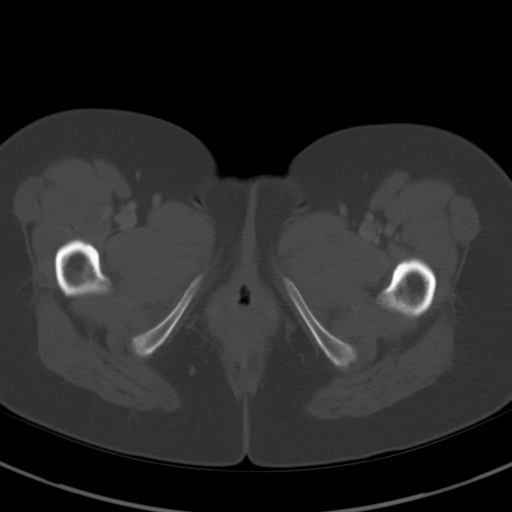
[im 14/88  soft-tissue]
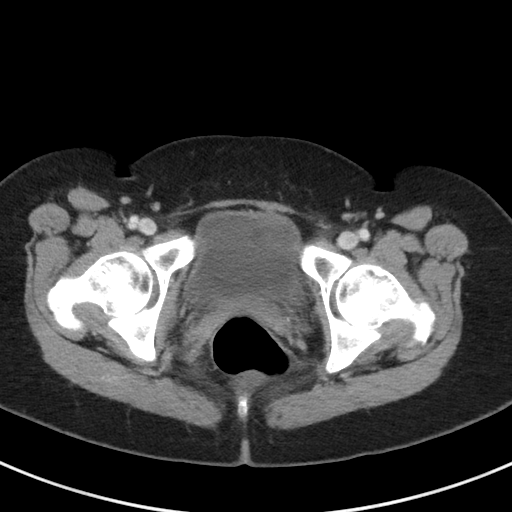
[im 19/88  soft-tissue]
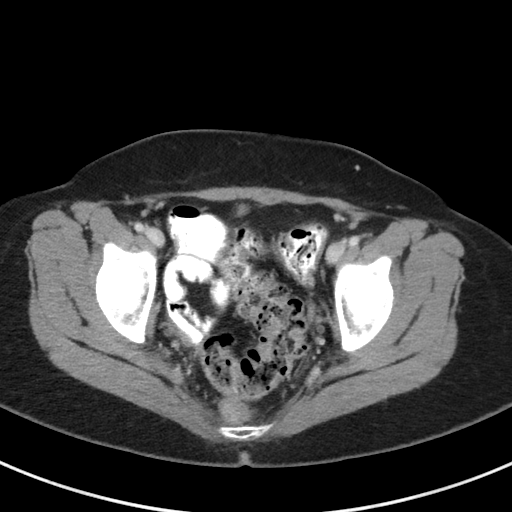
[im 28/88  soft-tissue]
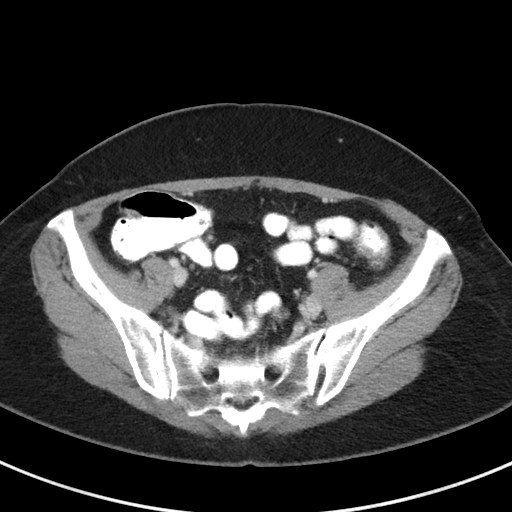
[im 33/88  soft-tissue]
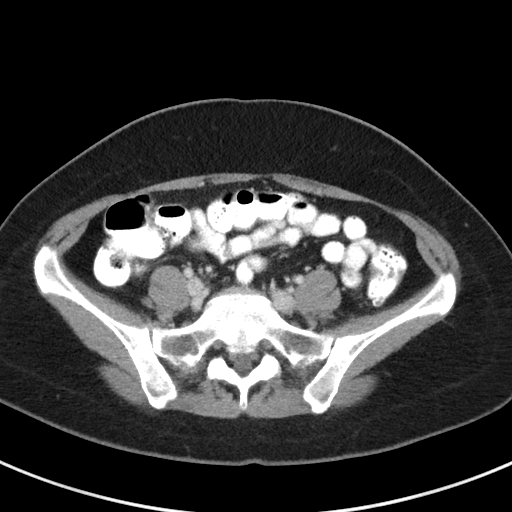
[im 42/88  soft-tissue]
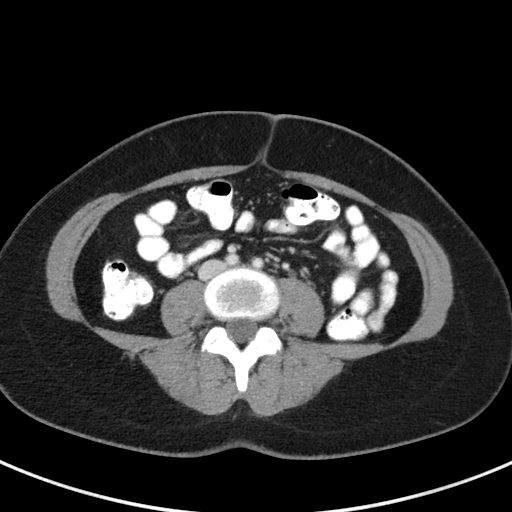
[im 46/88  soft-tissue]
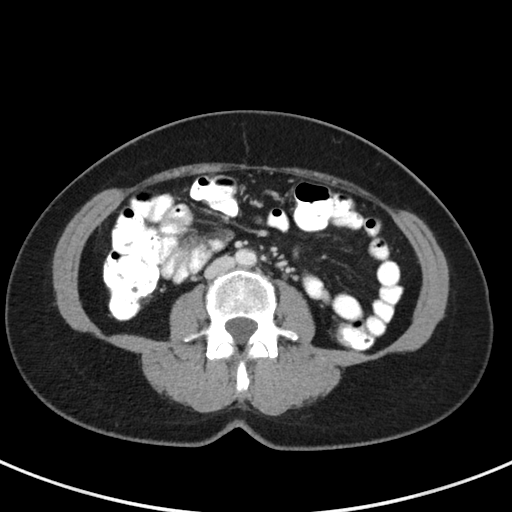
[im 55/88  soft-tissue]
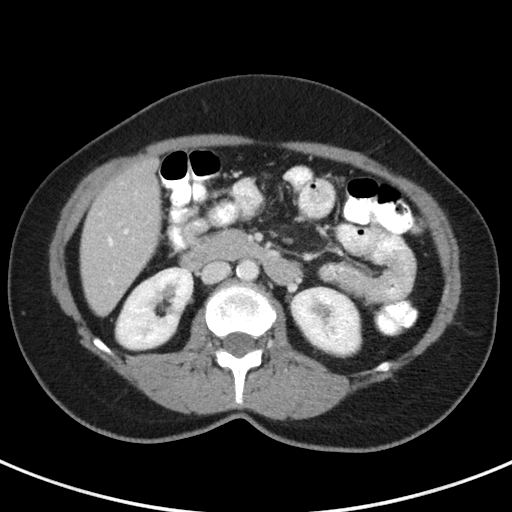
[im 60/88  soft-tissue]
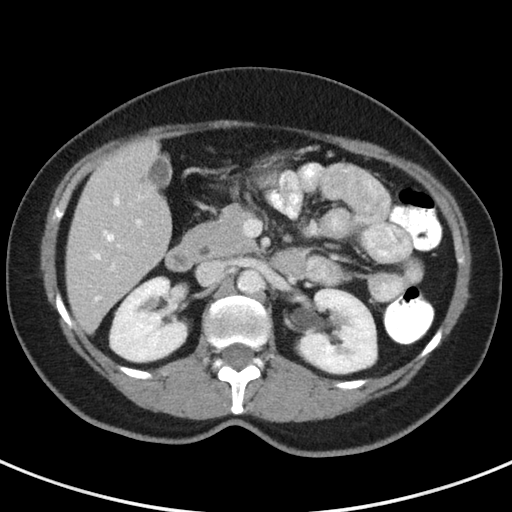
[im 60/88  bone]
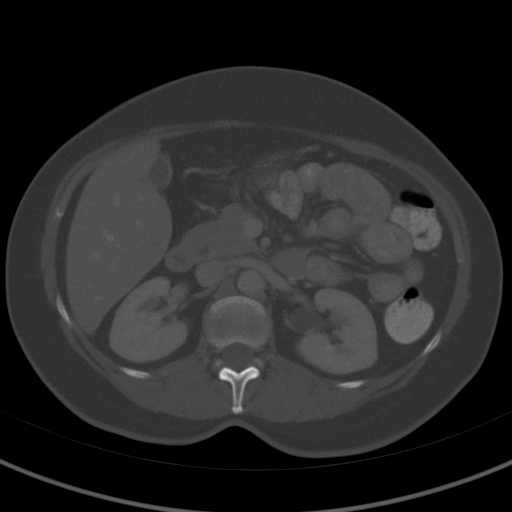
[im 69/88  soft-tissue]
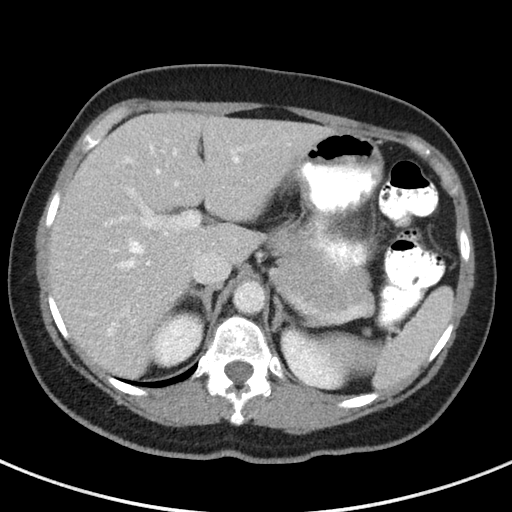
[im 74/88  soft-tissue]
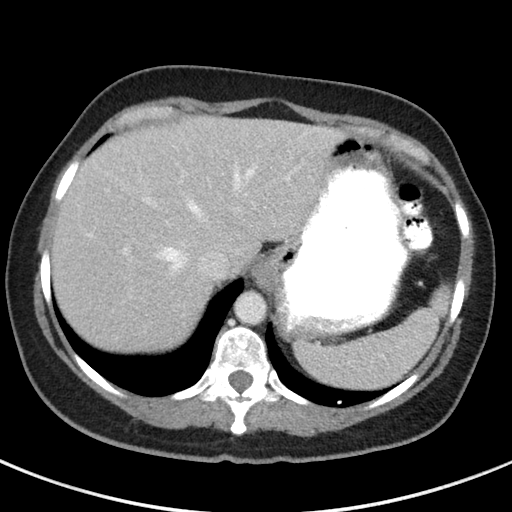
[im 83/88  soft-tissue]
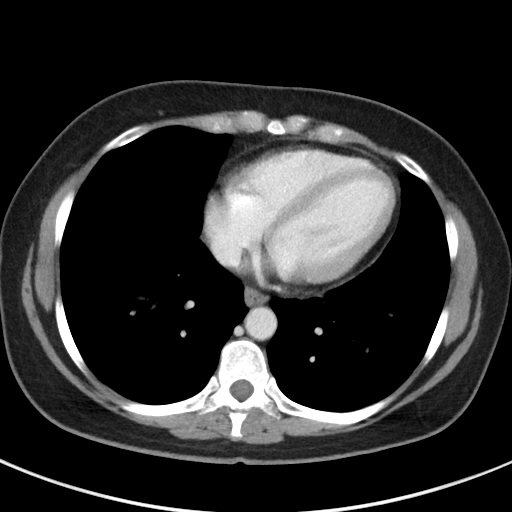

[Series 5: coronal st · coronal · 0.58mm/px · 3 of 73 slices shown]
[im 25/73  soft-tissue]
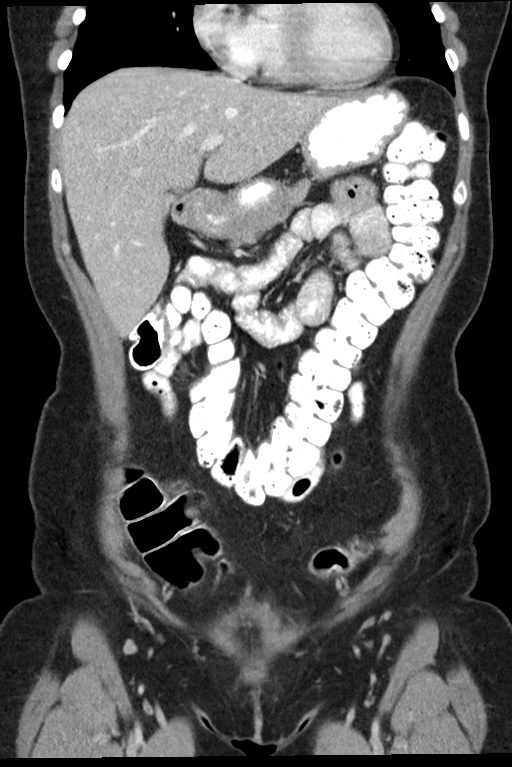
[im 33/73  soft-tissue]
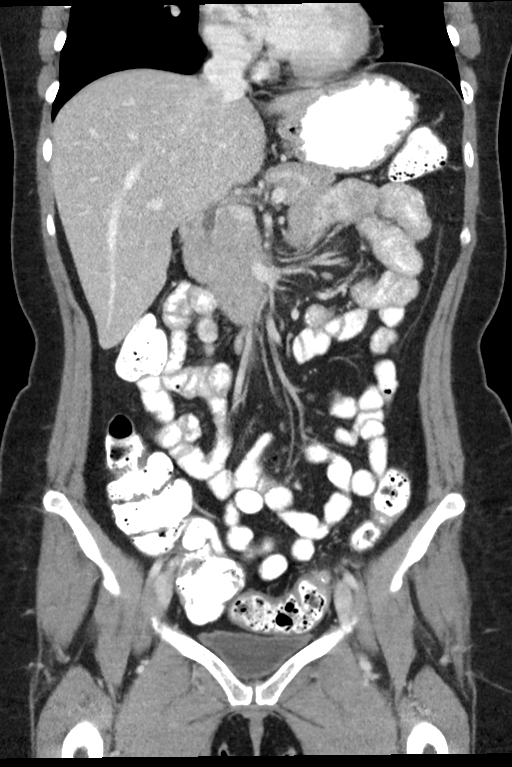
[im 41/73  soft-tissue]
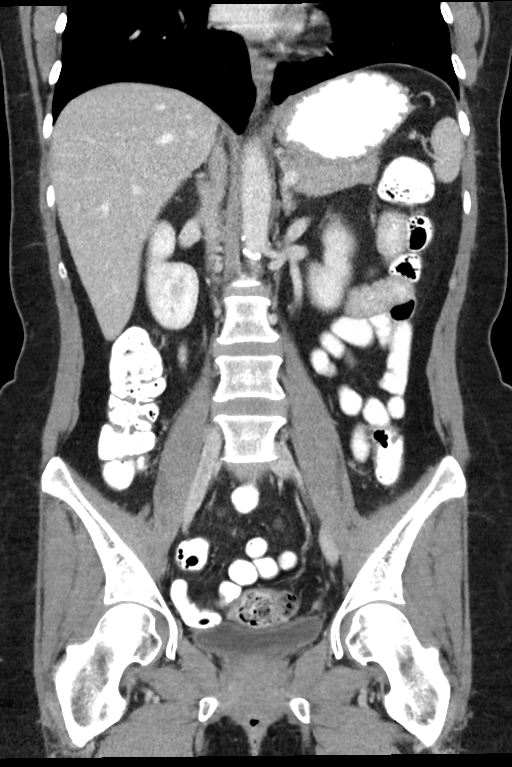

[15 of 46 positions shown; findings below may reference images not displayed]

FINDINGS: Lower chest: 3 mm nodule in the lateral aspect of the right middle
lobe on image 3 of series 4, indeterminate. Calcified 3 mm granuloma
at the left lung base posteriorly, unchanged. The visualized portion
of heart appears normal.

Hepatobiliary: No focal liver abnormality is seen. No gallstones,
gallbladder wall thickening, or biliary dilatation.

Pancreas: Unremarkable. No pancreatic ductal dilatation or
surrounding inflammatory changes.

Spleen: Normal in size without focal abnormality.

Adrenals/Urinary Tract: Adrenal glands are unremarkable. Kidneys are
normal, without renal calculi, focal lesion, or hydronephrosis.
Bladder is unremarkable.

Stomach/Bowel: Stomach is within normal limits. Appendix appears
normal. No evidence of bowel wall thickening, distention, or
inflammatory changes. There are few diverticula in the sigmoid
portion of the colon. No appreciable diverticulitis.

Vascular/Lymphatic: Aortic atherosclerosis. No enlarged abdominal or
pelvic lymph nodes.

Reproductive: Status post hysterectomy. No adnexal masses.

Other: No abdominal wall hernia or abnormality. No abdominopelvic
ascites.

Musculoskeletal: No acute or significant osseous findings.
IMPRESSION: 1. Minimal diverticulosis of the distal colon.  No diverticulitis.
2. Aortic atherosclerosis.
3. Otherwise benign appearing abdomen.
4. 3 mm nodule in the right middle lobe, nonspecific. No follow-up
needed if patient is low-risk. Non-contrast chest CT can be
considered in 12 months if patient is high-risk. This recommendation
follows the consensus statement: Guidelines for Management of
Incidental Pulmonary Nodules Detected on CT Images: From the

## 2020-03-26 DIAGNOSIS — E7800 Pure hypercholesterolemia, unspecified: Secondary | ICD-10-CM | POA: Diagnosis not present

## 2020-04-23 DIAGNOSIS — R5383 Other fatigue: Secondary | ICD-10-CM | POA: Diagnosis not present

## 2020-04-23 DIAGNOSIS — G43009 Migraine without aura, not intractable, without status migrainosus: Secondary | ICD-10-CM | POA: Diagnosis not present

## 2020-04-23 DIAGNOSIS — M25562 Pain in left knee: Secondary | ICD-10-CM | POA: Diagnosis not present

## 2020-04-23 DIAGNOSIS — E782 Mixed hyperlipidemia: Secondary | ICD-10-CM | POA: Diagnosis not present

## 2020-04-23 DIAGNOSIS — M7989 Other specified soft tissue disorders: Secondary | ICD-10-CM | POA: Diagnosis not present

## 2020-05-01 DIAGNOSIS — G43009 Migraine without aura, not intractable, without status migrainosus: Secondary | ICD-10-CM | POA: Diagnosis not present

## 2020-05-01 DIAGNOSIS — R519 Headache, unspecified: Secondary | ICD-10-CM | POA: Diagnosis not present

## 2020-07-02 IMAGING — US ULTRASOUND ABDOMEN LIMITED
1 series · 14 of 25 positions shown · non-contrast
Comparison: CT abdomen and pelvis 11/08/2014

CLINICAL DATA: Elevated LFTs, history hyperlipidemia, former smoker

EXAM:
ULTRASOUND ABDOMEN LIMITED RIGHT UPPER QUADRANT

[Series 1: ultrasound abdomen limited · 0.15mm/px · 14 of 76 slices shown]
[im 1/76]
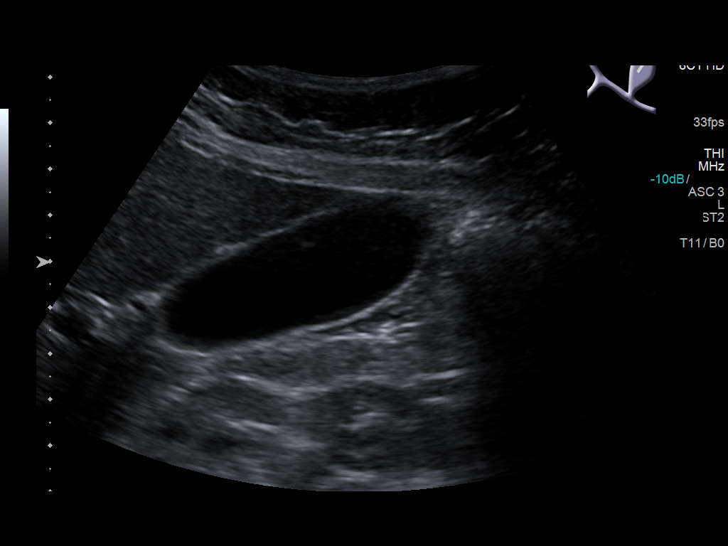
[im 7/76]
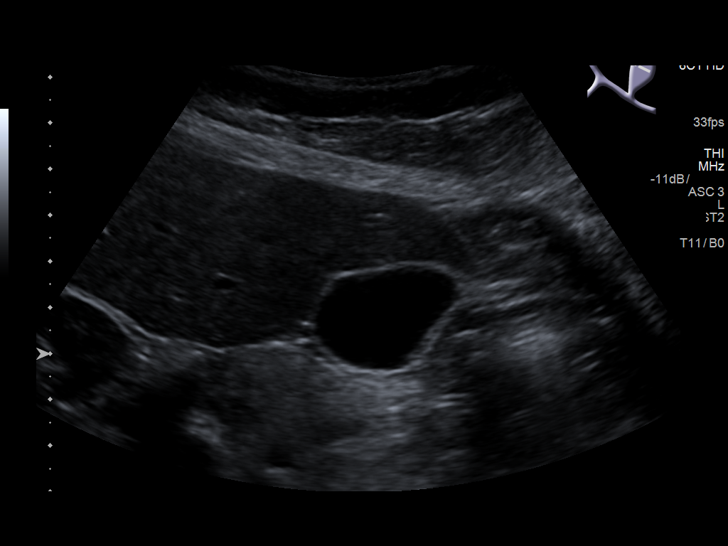
[im 13/76]
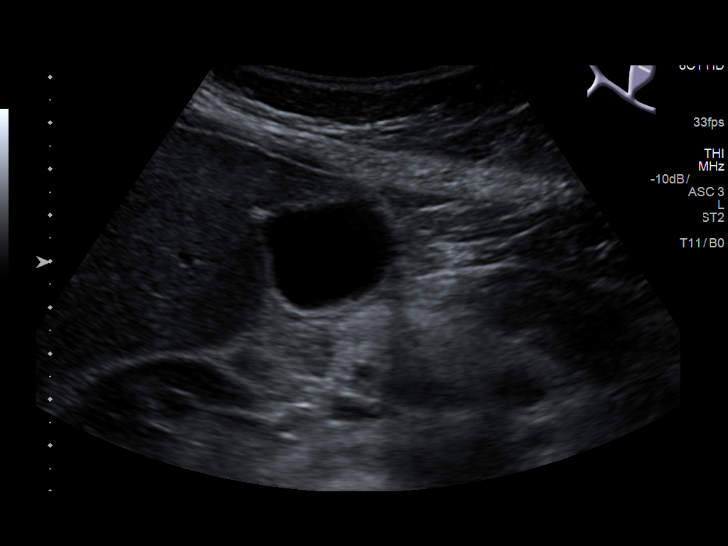
[im 19/76]
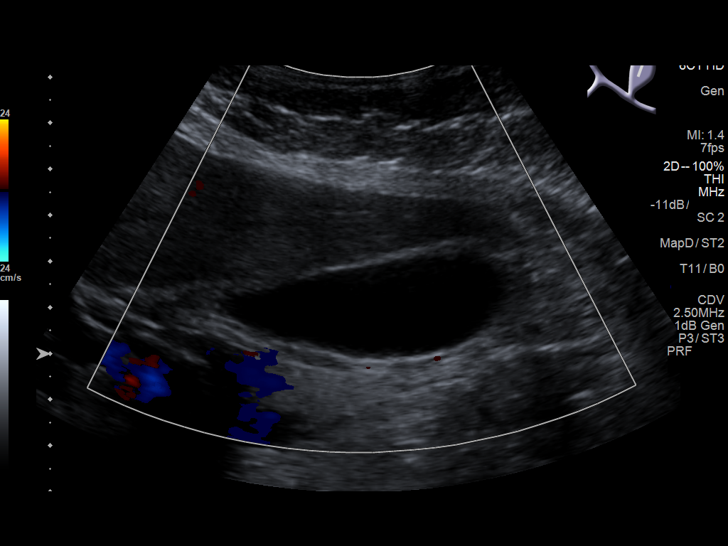
[im 26/76]
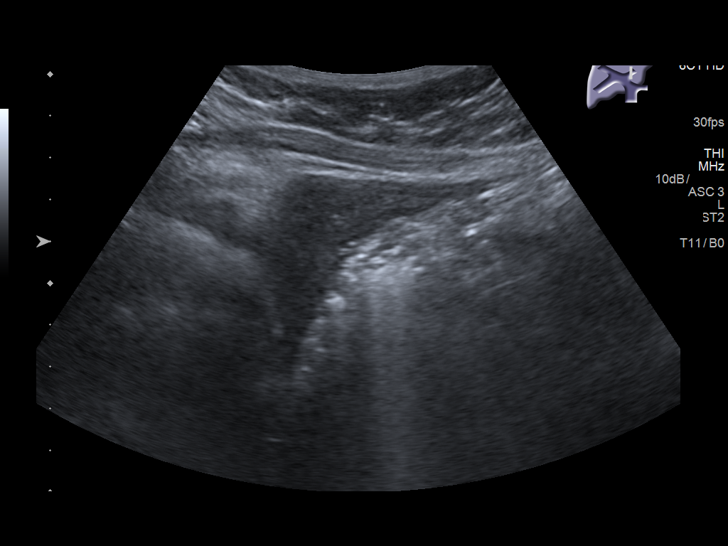
[im 29/76]
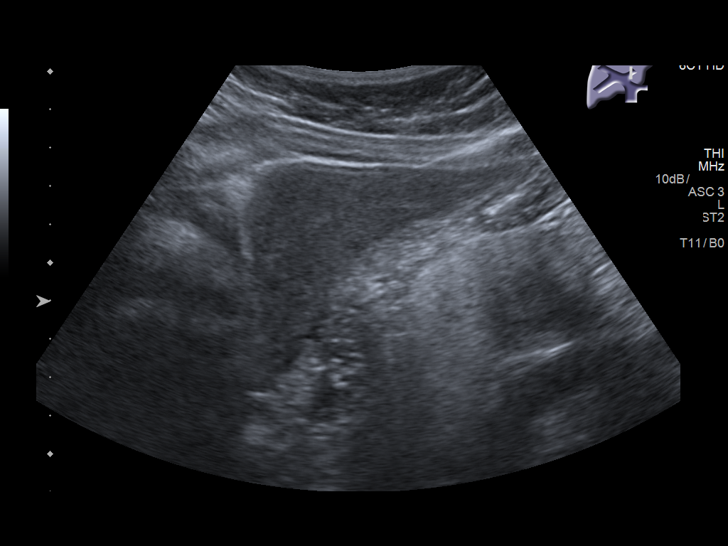
[im 35/76]
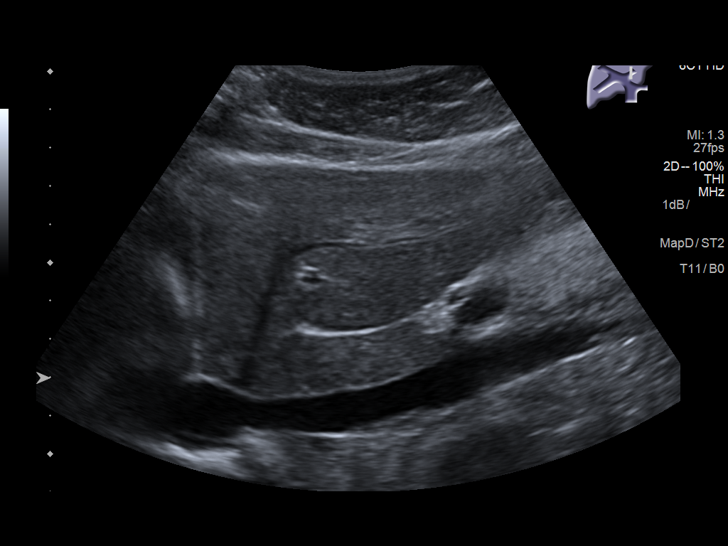
[im 41/76]
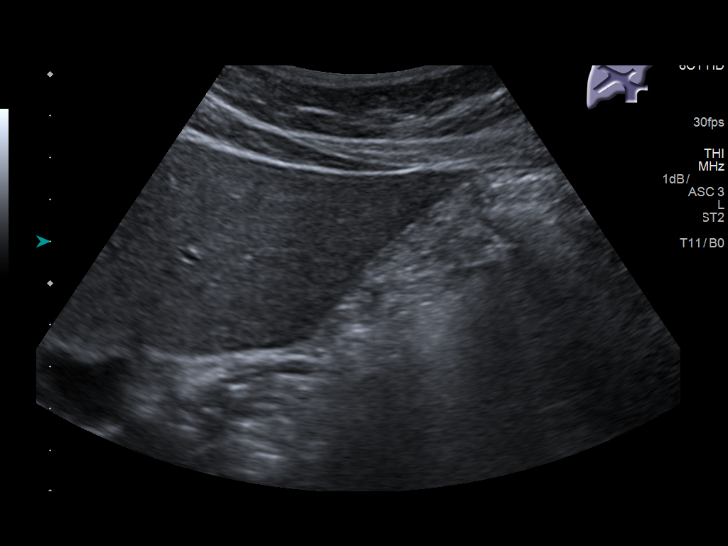
[im 47/76]
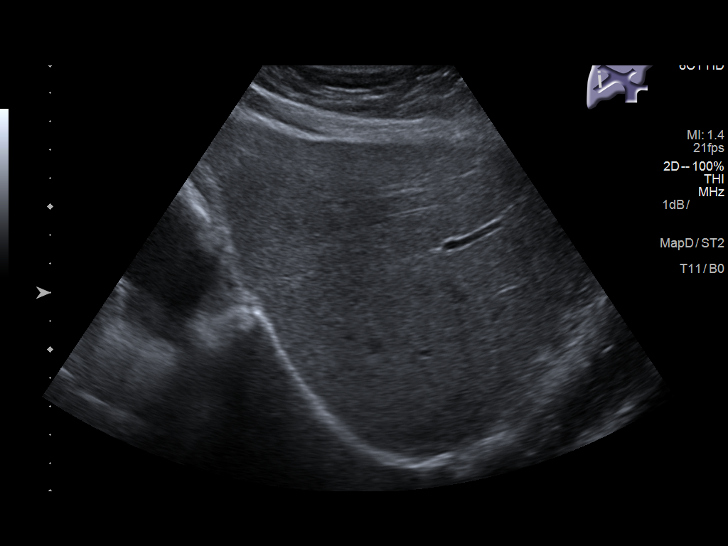
[im 51/76]
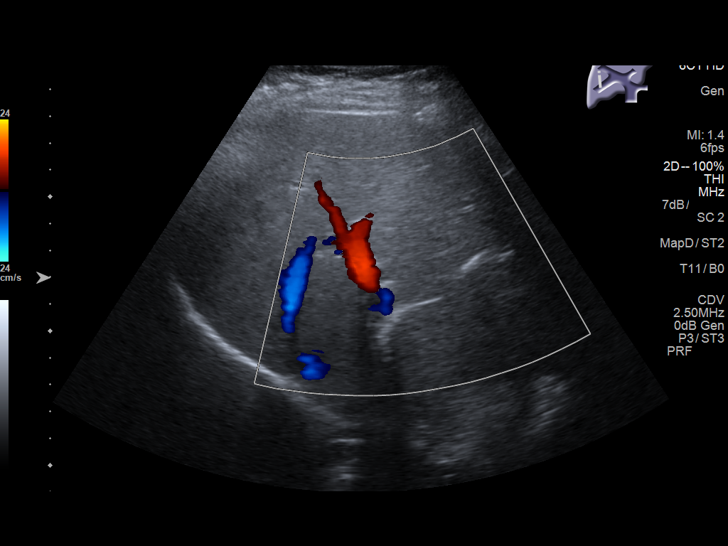
[im 57/76]
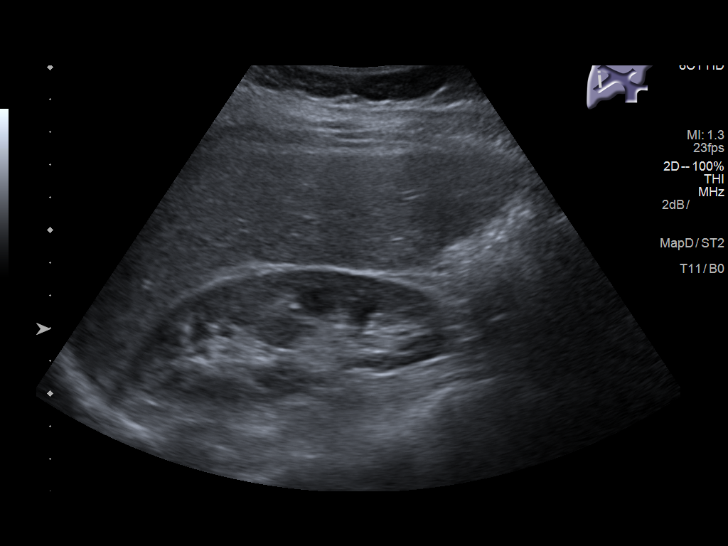
[im 63/76]
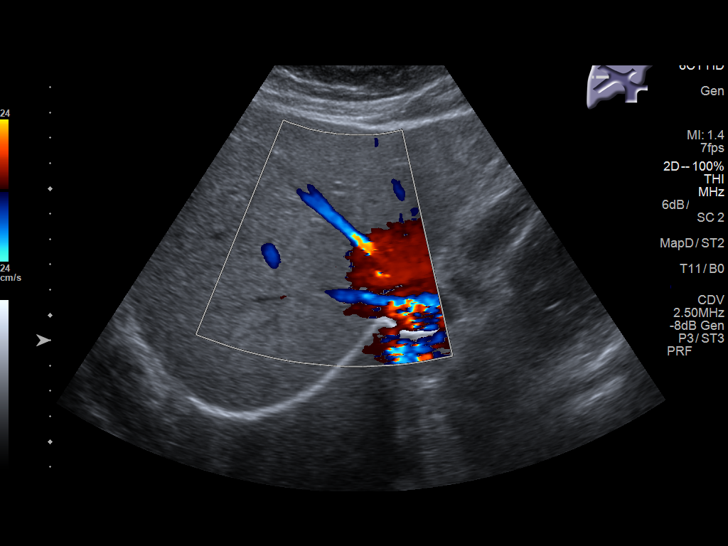
[im 69/76]
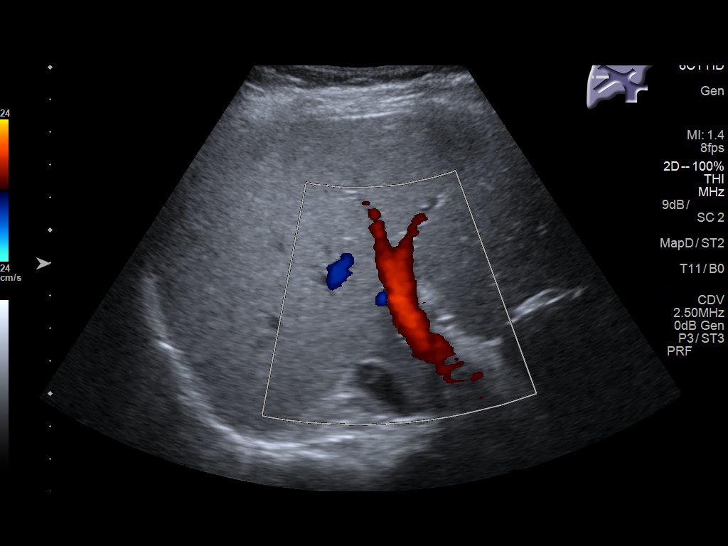
[im 76/76]
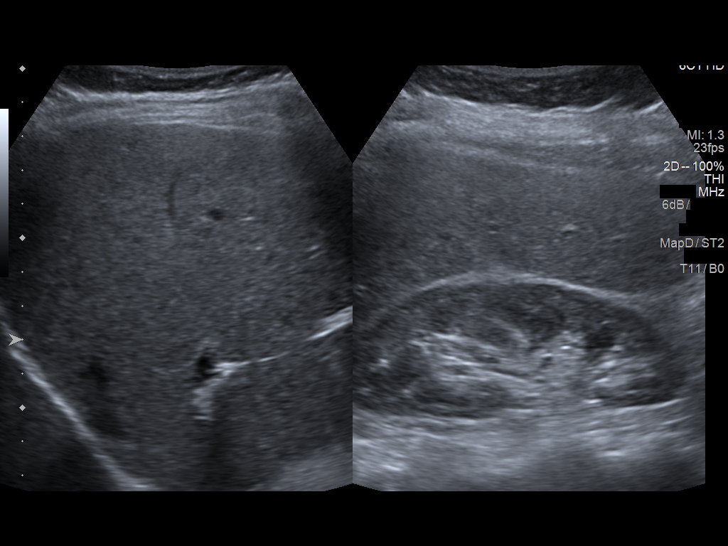

[14 of 25 positions shown; findings below may reference images not displayed]

FINDINGS: Gallbladder:

Normally distended without stones or wall thickening. No
pericholecystic fluid or sonographic Murphy sign.

Common bile duct:

Diameter: 4 mm diameter, normal

Liver:

Minimally increased hepatic parenchymal echogenicity question fatty
infiltration though this can be seen with cirrhosis and certain
infiltrative disorders. Hepatic margins appear smooth without
evidence of hepatic mass or nodularity. Portal vein is patent on
color Doppler imaging with normal direction of blood flow towards
the liver.

No RIGHT upper quadrant free fluid.
IMPRESSION: Probable mild fatty infiltration of liver as above.

Otherwise negative exam.

## 2020-09-06 ENCOUNTER — Other Ambulatory Visit: Payer: Self-pay | Admitting: Internal Medicine

## 2020-09-11 ENCOUNTER — Other Ambulatory Visit: Payer: Self-pay | Admitting: Internal Medicine
